# Patient Record
Sex: Female | Born: 1955
Health system: Southern US, Community
[De-identification: ages and names within clinical notes are randomized; demographics above are authoritative.]

## PROBLEM LIST (undated history)

## (undated) DIAGNOSIS — E079 Disorder of thyroid, unspecified: Secondary | ICD-10-CM

## (undated) DIAGNOSIS — F419 Anxiety disorder, unspecified: Secondary | ICD-10-CM

## (undated) DIAGNOSIS — M8580 Other specified disorders of bone density and structure, unspecified site: Secondary | ICD-10-CM

## (undated) DIAGNOSIS — F329 Major depressive disorder, single episode, unspecified: Secondary | ICD-10-CM

## (undated) DIAGNOSIS — K529 Noninfective gastroenteritis and colitis, unspecified: Secondary | ICD-10-CM

## (undated) DIAGNOSIS — J449 Chronic obstructive pulmonary disease, unspecified: Secondary | ICD-10-CM

## (undated) DIAGNOSIS — F32A Depression, unspecified: Secondary | ICD-10-CM

## (undated) HISTORY — DX: Major depressive disorder, single episode, unspecified: F32.9

## (undated) HISTORY — DX: Depression, unspecified: F32.A

## (undated) HISTORY — DX: Anxiety disorder, unspecified: F41.9

## (undated) HISTORY — DX: Disorder of thyroid, unspecified: E07.9

## (undated) HISTORY — DX: Noninfective gastroenteritis and colitis, unspecified: K52.9

## (undated) HISTORY — DX: Chronic obstructive pulmonary disease, unspecified: J44.9

## (undated) HISTORY — PX: DILATION AND CURETTAGE OF UTERUS: SHX78

## (undated) HISTORY — DX: Other specified disorders of bone density and structure, unspecified site: M85.80

---

## 1998-11-22 ENCOUNTER — Other Ambulatory Visit: Admission: RE | Admit: 1998-11-22 | Discharge: 1998-11-22 | Payer: Self-pay | Admitting: *Deleted

## 1998-11-30 ENCOUNTER — Other Ambulatory Visit: Admission: RE | Admit: 1998-11-30 | Discharge: 1998-11-30 | Payer: Self-pay | Admitting: *Deleted

## 1998-12-20 ENCOUNTER — Encounter (INDEPENDENT_AMBULATORY_CARE_PROVIDER_SITE_OTHER): Payer: Self-pay | Admitting: Specialist

## 1998-12-20 ENCOUNTER — Other Ambulatory Visit: Admission: RE | Admit: 1998-12-20 | Discharge: 1998-12-20 | Payer: Self-pay | Admitting: *Deleted

## 2000-08-17 ENCOUNTER — Other Ambulatory Visit: Admission: RE | Admit: 2000-08-17 | Discharge: 2000-08-17 | Payer: Self-pay | Admitting: *Deleted

## 2001-09-14 ENCOUNTER — Encounter: Admission: RE | Admit: 2001-09-14 | Discharge: 2001-09-14 | Payer: Self-pay | Admitting: Family Medicine

## 2001-09-14 ENCOUNTER — Encounter: Payer: Self-pay | Admitting: Family Medicine

## 2002-10-17 ENCOUNTER — Ambulatory Visit (HOSPITAL_COMMUNITY): Admission: RE | Admit: 2002-10-17 | Discharge: 2002-10-17 | Payer: Self-pay | Admitting: Pulmonary Disease

## 2002-10-17 ENCOUNTER — Encounter: Payer: Self-pay | Admitting: Pulmonary Disease

## 2003-05-25 ENCOUNTER — Other Ambulatory Visit: Admission: RE | Admit: 2003-05-25 | Discharge: 2003-05-25 | Payer: Self-pay | Admitting: Family Medicine

## 2003-06-20 ENCOUNTER — Ambulatory Visit (HOSPITAL_COMMUNITY): Admission: RE | Admit: 2003-06-20 | Discharge: 2003-06-20 | Payer: Self-pay | Admitting: Pulmonary Disease

## 2004-07-15 ENCOUNTER — Ambulatory Visit: Payer: Self-pay | Admitting: Family Medicine

## 2004-11-06 ENCOUNTER — Ambulatory Visit: Payer: Self-pay | Admitting: Family Medicine

## 2005-05-22 ENCOUNTER — Ambulatory Visit: Payer: Self-pay | Admitting: Family Medicine

## 2005-09-23 ENCOUNTER — Ambulatory Visit: Payer: Self-pay | Admitting: Family Medicine

## 2005-10-07 ENCOUNTER — Ambulatory Visit: Payer: Self-pay | Admitting: Family Medicine

## 2005-10-14 ENCOUNTER — Ambulatory Visit: Payer: Self-pay | Admitting: Family Medicine

## 2006-01-28 ENCOUNTER — Ambulatory Visit: Payer: Self-pay | Admitting: Family Medicine

## 2006-02-17 ENCOUNTER — Ambulatory Visit: Payer: Self-pay | Admitting: Family Medicine

## 2006-04-16 DIAGNOSIS — M899 Disorder of bone, unspecified: Secondary | ICD-10-CM | POA: Insufficient documentation

## 2006-04-16 DIAGNOSIS — M949 Disorder of cartilage, unspecified: Secondary | ICD-10-CM

## 2006-04-16 DIAGNOSIS — M810 Age-related osteoporosis without current pathological fracture: Secondary | ICD-10-CM | POA: Insufficient documentation

## 2006-04-16 DIAGNOSIS — M8580 Other specified disorders of bone density and structure, unspecified site: Secondary | ICD-10-CM | POA: Insufficient documentation

## 2006-04-16 LAB — HM DEXA SCAN

## 2006-04-20 ENCOUNTER — Ambulatory Visit: Payer: Self-pay | Admitting: Family Medicine

## 2006-05-18 ENCOUNTER — Encounter: Payer: Self-pay | Admitting: Family Medicine

## 2006-05-18 ENCOUNTER — Ambulatory Visit: Payer: Self-pay | Admitting: Family Medicine

## 2006-05-18 ENCOUNTER — Other Ambulatory Visit: Admission: RE | Admit: 2006-05-18 | Discharge: 2006-05-18 | Payer: Self-pay | Admitting: Family Medicine

## 2006-05-18 LAB — CONVERTED CEMR LAB: Pap Smear: NORMAL

## 2006-07-03 ENCOUNTER — Ambulatory Visit: Payer: Self-pay | Admitting: Family Medicine

## 2006-08-12 ENCOUNTER — Ambulatory Visit: Payer: Self-pay | Admitting: Family Medicine

## 2006-09-01 ENCOUNTER — Encounter: Payer: Self-pay | Admitting: Family Medicine

## 2006-09-01 DIAGNOSIS — F419 Anxiety disorder, unspecified: Secondary | ICD-10-CM | POA: Insufficient documentation

## 2006-09-01 DIAGNOSIS — J449 Chronic obstructive pulmonary disease, unspecified: Secondary | ICD-10-CM | POA: Insufficient documentation

## 2006-09-03 ENCOUNTER — Ambulatory Visit: Payer: Self-pay | Admitting: Family Medicine

## 2006-12-23 ENCOUNTER — Ambulatory Visit: Payer: Self-pay | Admitting: Family Medicine

## 2006-12-23 ENCOUNTER — Encounter (INDEPENDENT_AMBULATORY_CARE_PROVIDER_SITE_OTHER): Payer: Self-pay | Admitting: Internal Medicine

## 2007-01-04 ENCOUNTER — Telehealth (INDEPENDENT_AMBULATORY_CARE_PROVIDER_SITE_OTHER): Payer: Self-pay | Admitting: *Deleted

## 2007-01-05 ENCOUNTER — Encounter (INDEPENDENT_AMBULATORY_CARE_PROVIDER_SITE_OTHER): Payer: Self-pay | Admitting: Internal Medicine

## 2007-01-06 ENCOUNTER — Ambulatory Visit: Payer: Self-pay | Admitting: Family Medicine

## 2007-01-29 ENCOUNTER — Encounter: Payer: Self-pay | Admitting: Family Medicine

## 2007-03-03 ENCOUNTER — Ambulatory Visit: Payer: Self-pay | Admitting: Family Medicine

## 2007-03-08 ENCOUNTER — Telehealth (INDEPENDENT_AMBULATORY_CARE_PROVIDER_SITE_OTHER): Payer: Self-pay | Admitting: *Deleted

## 2007-03-12 ENCOUNTER — Ambulatory Visit: Payer: Self-pay | Admitting: Family Medicine

## 2007-03-22 ENCOUNTER — Encounter: Payer: Self-pay | Admitting: Family Medicine

## 2007-03-30 ENCOUNTER — Telehealth: Payer: Self-pay | Admitting: Family Medicine

## 2007-08-25 ENCOUNTER — Telehealth: Payer: Self-pay | Admitting: Family Medicine

## 2007-10-07 ENCOUNTER — Ambulatory Visit: Payer: Self-pay | Admitting: Family Medicine

## 2007-11-16 ENCOUNTER — Ambulatory Visit: Payer: Self-pay | Admitting: Family Medicine

## 2007-11-29 ENCOUNTER — Telehealth: Payer: Self-pay | Admitting: Family Medicine

## 2008-02-04 ENCOUNTER — Encounter: Payer: Self-pay | Admitting: Family Medicine

## 2008-02-15 ENCOUNTER — Encounter: Payer: Self-pay | Admitting: Family Medicine

## 2008-02-23 ENCOUNTER — Encounter: Payer: Self-pay | Admitting: Family Medicine

## 2008-03-03 ENCOUNTER — Encounter: Payer: Self-pay | Admitting: Family Medicine

## 2008-04-19 ENCOUNTER — Encounter: Payer: Self-pay | Admitting: Family Medicine

## 2008-06-21 ENCOUNTER — Ambulatory Visit: Payer: Self-pay | Admitting: Family Medicine

## 2008-06-26 ENCOUNTER — Encounter: Payer: Self-pay | Admitting: Family Medicine

## 2008-07-25 ENCOUNTER — Ambulatory Visit: Payer: Self-pay | Admitting: Family Medicine

## 2008-07-26 LAB — CONVERTED CEMR LAB
BUN: 9 mg/dL (ref 6–23)
CO2: 30 meq/L (ref 19–32)
Calcium: 8.9 mg/dL (ref 8.4–10.5)
Chloride: 104 meq/L (ref 96–112)
Creatinine, Ser: 0.8 mg/dL (ref 0.4–1.2)
GFR calc Af Amer: 97 mL/min
GFR calc non Af Amer: 80 mL/min
Glucose, Bld: 83 mg/dL (ref 70–99)
Potassium: 4 meq/L (ref 3.5–5.1)
Sodium: 139 meq/L (ref 135–145)
TSH: 3.24 microintl units/mL (ref 0.35–5.50)

## 2008-08-07 ENCOUNTER — Encounter: Payer: Self-pay | Admitting: Family Medicine

## 2008-08-08 ENCOUNTER — Telehealth: Payer: Self-pay | Admitting: Family Medicine

## 2008-08-25 ENCOUNTER — Ambulatory Visit: Payer: Self-pay | Admitting: Family Medicine

## 2008-08-25 DIAGNOSIS — J209 Acute bronchitis, unspecified: Secondary | ICD-10-CM | POA: Insufficient documentation

## 2008-08-25 DIAGNOSIS — F17200 Nicotine dependence, unspecified, uncomplicated: Secondary | ICD-10-CM | POA: Insufficient documentation

## 2008-08-28 ENCOUNTER — Encounter: Payer: Self-pay | Admitting: Family Medicine

## 2008-08-30 DIAGNOSIS — J984 Other disorders of lung: Secondary | ICD-10-CM | POA: Insufficient documentation

## 2008-08-31 ENCOUNTER — Telehealth: Payer: Self-pay | Admitting: Family Medicine

## 2008-09-01 ENCOUNTER — Encounter: Payer: Self-pay | Admitting: Family Medicine

## 2008-09-20 ENCOUNTER — Ambulatory Visit: Payer: Self-pay | Admitting: Family Medicine

## 2008-12-28 ENCOUNTER — Telehealth: Payer: Self-pay | Admitting: Family Medicine

## 2009-02-02 ENCOUNTER — Ambulatory Visit: Payer: Self-pay | Admitting: Family Medicine

## 2009-02-02 DIAGNOSIS — F411 Generalized anxiety disorder: Secondary | ICD-10-CM | POA: Insufficient documentation

## 2009-05-30 ENCOUNTER — Encounter: Payer: Self-pay | Admitting: Family Medicine

## 2009-07-04 ENCOUNTER — Telehealth: Payer: Self-pay | Admitting: Family Medicine

## 2009-07-06 ENCOUNTER — Ambulatory Visit: Payer: Self-pay | Admitting: Family Medicine

## 2009-08-03 ENCOUNTER — Encounter: Payer: Self-pay | Admitting: Family Medicine

## 2009-09-05 ENCOUNTER — Telehealth: Payer: Self-pay | Admitting: Family Medicine

## 2009-09-21 ENCOUNTER — Other Ambulatory Visit: Admission: RE | Admit: 2009-09-21 | Discharge: 2009-09-21 | Payer: Self-pay | Admitting: Family Medicine

## 2009-09-21 ENCOUNTER — Ambulatory Visit: Payer: Self-pay | Admitting: Family Medicine

## 2009-09-24 ENCOUNTER — Encounter: Payer: Self-pay | Admitting: Family Medicine

## 2009-09-24 LAB — CONVERTED CEMR LAB
ALT: 13 units/L (ref 0–35)
AST: 17 units/L (ref 0–37)
Albumin: 4.2 g/dL (ref 3.5–5.2)
Alkaline Phosphatase: 96 units/L (ref 39–117)
BUN: 10 mg/dL (ref 6–23)
Basophils Absolute: 0 10*3/uL (ref 0.0–0.1)
Basophils Relative: 0.4 % (ref 0.0–3.0)
Bilirubin, Direct: 0 mg/dL (ref 0.0–0.3)
CO2: 30 meq/L (ref 19–32)
Calcium: 9.2 mg/dL (ref 8.4–10.5)
Chloride: 106 meq/L (ref 96–112)
Cholesterol: 149 mg/dL (ref 0–200)
Creatinine, Ser: 0.9 mg/dL (ref 0.4–1.2)
Eosinophils Absolute: 0.1 10*3/uL (ref 0.0–0.7)
Eosinophils Relative: 0.6 % (ref 0.0–5.0)
GFR calc non Af Amer: 69.8 mL/min (ref >60)
Glucose, Bld: 85 mg/dL (ref 70–99)
HCT: 41.6 % (ref 36.0–46.0)
HDL: 46.1 mg/dL (ref >39.00)
Hemoglobin: 14.5 g/dL (ref 12.0–15.0)
LDL Cholesterol: 85 mg/dL (ref 0–99)
Lymphocytes Relative: 23.1 % (ref 12.0–46.0)
Lymphs Abs: 2 10*3/uL (ref 0.7–4.0)
MCHC: 34.9 g/dL (ref 30.0–36.0)
MCV: 92.9 fL (ref 78.0–100.0)
Monocytes Absolute: 0.6 10*3/uL (ref 0.1–1.0)
Monocytes Relative: 6.6 % (ref 3.0–12.0)
Neutro Abs: 5.9 10*3/uL (ref 1.4–7.7)
Neutrophils Relative %: 69.3 % (ref 43.0–77.0)
Platelets: 282 10*3/uL (ref 150.0–400.0)
Potassium: 4.1 meq/L (ref 3.5–5.1)
RBC: 4.48 M/uL (ref 3.87–5.11)
RDW: 12.1 % (ref 11.5–14.6)
Sodium: 144 meq/L (ref 135–145)
Total Bilirubin: 0.6 mg/dL (ref 0.3–1.2)
Total CHOL/HDL Ratio: 3
Total Protein: 7.4 g/dL (ref 6.0–8.3)
Triglycerides: 92 mg/dL (ref 0.0–149.0)
VLDL: 18.4 mg/dL (ref 0.0–40.0)
Vit D, 25-Hydroxy: 26 ng/mL — ABNORMAL LOW (ref 30–89)
WBC: 8.5 10*3/uL (ref 4.5–10.5)

## 2009-09-25 ENCOUNTER — Encounter: Payer: Self-pay | Admitting: Family Medicine

## 2009-09-25 LAB — HM MAMMOGRAPHY: HM Mammogram: NORMAL

## 2009-09-27 ENCOUNTER — Encounter: Payer: Self-pay | Admitting: Family Medicine

## 2009-10-01 ENCOUNTER — Encounter (INDEPENDENT_AMBULATORY_CARE_PROVIDER_SITE_OTHER): Payer: Self-pay | Admitting: *Deleted

## 2009-10-01 ENCOUNTER — Encounter: Payer: Self-pay | Admitting: Family Medicine

## 2009-10-02 ENCOUNTER — Encounter (INDEPENDENT_AMBULATORY_CARE_PROVIDER_SITE_OTHER): Payer: Self-pay | Admitting: *Deleted

## 2009-10-02 LAB — CONVERTED CEMR LAB: Pap Smear: NEGATIVE

## 2009-10-15 ENCOUNTER — Telehealth: Payer: Self-pay | Admitting: Family Medicine

## 2009-10-15 ENCOUNTER — Ambulatory Visit: Payer: Self-pay | Admitting: Family Medicine

## 2009-10-16 ENCOUNTER — Encounter: Payer: Self-pay | Admitting: Family Medicine

## 2009-10-23 ENCOUNTER — Ambulatory Visit: Payer: Self-pay | Admitting: Family Medicine

## 2009-10-23 DIAGNOSIS — H1045 Other chronic allergic conjunctivitis: Secondary | ICD-10-CM | POA: Insufficient documentation

## 2009-12-06 ENCOUNTER — Ambulatory Visit: Payer: Self-pay | Admitting: Family Medicine

## 2009-12-06 DIAGNOSIS — E559 Vitamin D deficiency, unspecified: Secondary | ICD-10-CM | POA: Insufficient documentation

## 2009-12-12 ENCOUNTER — Encounter: Payer: Self-pay | Admitting: Family Medicine

## 2009-12-12 ENCOUNTER — Ambulatory Visit: Payer: Self-pay | Admitting: Family Medicine

## 2009-12-19 ENCOUNTER — Telehealth: Payer: Self-pay | Admitting: Family Medicine

## 2010-01-09 ENCOUNTER — Ambulatory Visit: Payer: Self-pay | Admitting: Family Medicine

## 2010-01-25 ENCOUNTER — Ambulatory Visit: Payer: Self-pay | Admitting: *Deleted

## 2010-02-01 ENCOUNTER — Ambulatory Visit: Payer: Self-pay | Admitting: *Deleted

## 2010-02-19 ENCOUNTER — Telehealth: Payer: Self-pay | Admitting: Family Medicine

## 2010-04-05 ENCOUNTER — Ambulatory Visit: Payer: Self-pay | Admitting: Family Medicine

## 2010-07-16 NOTE — Assessment & Plan Note (Signed)
Summary: Follow up bone density Regina Ruiz   Vital Signs:  Patient profile:   55 year old female Height:      62.5 inches Weight:      123.75 pounds BMI:     22.35 Temp:     97.8 degrees F oral Pulse rate:   76 / minute Pulse rhythm:   regular BP sitting:   114 / 68  (left arm) Cuff size:   regular  Vitals Entered By: Lewanda Rife LPN (December 12, 2009 8:43 AM) CC: follow up after bone density and labs   History of Present Illness: here for f/u of bone density test   vit d level is better at 53 from 26 after high dose weekly tx   bmd in hip is down 10.6 % with T score of neg 1.8, and LS down 8.1 % is smoker and hypothyroid   has cut back even more with smoking -- less than 1ppd - making good progress with intent to quit  plans to set quit date - poss in sept   no stomach problems  no dental or jaw problems  no broken bones     much stress- her mother passed away on 10-30-22 , and husb was in hosp with alcoholic blackout      Allergies: 1)  Fiorinal (Butalbital-Aspirin-Caffeine) 2)  Demerol (Meperidine Hcl)  Past History:  Past Medical History: Last updated: 10/07/2007 Anxiety C O P D Depression Hyperthyroidism smoking  Past Surgical History: Last updated: 08/30/2008 D&C FNA thyroid (adenoma) endometrial by (-), 11/03 dexa, mild osterpenia 11/07 CXR (3/10)- fibrotic change, and pulmonary nodules  Family History: Last updated: 10/07/2007 Father: lung ca, brain ca  Mother:  Siblings: sister breast ca MGF cva son, congenital hip disease GF cva distant M relatives- ? heart problems  Social History: Last updated: 07/06/2009 married 1 child Patient currently smokes.  husband is chronically ill with cva and alcoholism  Risk Factors: Smoking Status: current (09/01/2006)  Review of Systems General:  Denies fatigue, loss of appetite, and malaise. Eyes:  Denies blurring. ENT:  Denies sore throat. CV:  Denies chest pain or discomfort and  palpitations. Resp:  Denies cough and wheezing. GI:  Denies abdominal pain, indigestion, nausea, and vomiting. MS:  Denies joint pain, cramps, muscle weakness, and stiffness. Derm:  Denies itching, lesion(s), and rash. Neuro:  Denies numbness and tingling. Psych:  very stressed caring for alcoholic husband. Endo:  Denies cold intolerance, excessive thirst, excessive urination, and heat intolerance. Heme:  Denies abnormal bruising and bleeding.  Physical Exam  General:  petite female in no distress Head:  normocephalic, atraumatic, and no abnormalities observed.   Mouth:  pharynx pink and moist.   Neck:  No deformities, masses, or tenderness noted. Lungs:  diffusely distant bs- no wheeze Heart:  Normal rate and regular rhythm. S1 and S2 normal without gallop, murmur, click, rub or other extra sounds. Abdomen:  soft and non-tender.   Msk:  No deformity or scoliosis noted of thoracic or lumbar spine.  no acute joint changes  Extremities:  No clubbing, cyanosis, edema, or deformity noted with normal full range of motion of all joints.   Neurologic:  gait normal and DTRs symmetrical and normal.   Skin:  Intact without suspicious lesions or rashes Cervical Nodes:  No lymphadenopathy noted Psych:  nl affect- talks candidly about stress    Impression & Recommendations:  Problem # 1:  OSTEOPENIA (ICD-733.90) Assessment Deteriorated in pt with risk factors of petite frame/ smoking/ hypothyroid /  vit D def will inc vit D to 2000 international units daiily urged compliance with ca  start fosamax-disc pros and cons in detail- if side eff inst to stop and update safety issues disc in detail today plan dexa in 2 y The following medications were removed from the medication list:    Vitamin D (ergocalciferol) 50000 Unit Caps (Ergocalciferol) .[name]... Take one capsule by mouth every week for 10 weeks Her updated medication list for this problem includes:    Vitamin D 400 Unit Tabs  (Cholecalciferol) .[name]... Take 1 tablet by mouth once a day    Fosamax 70 Mg Tabs (Alendronate sodium) .[name]... 1 by mouth once weekly as directed  Problem # 2:  UNSPECIFIED VITAMIN D DEFICIENCY (ICD-268.9) Assessment: Improved much imp after high dose weekly tx  inc daily to 2000 international units  re check about 6 mo   Problem # 3:  ANXIETY (ICD-300.00) Assessment: Unchanged disc recent sit stress ok overall/ some grief  good supp in family no change in med  Her updated medication list for this problem includes:    Alprazolam 0.25 Mg Tabs (Alprazolam) .[name]... 1/2 - 1 two times a day as needed    Buspirone Hcl 15 Mg Tabs (Buspirone hcl) .[name]... 1/2 by mouth two times a day for anxiety  Problem # 4:  CIGARETTE SMOKER (ICD-305.1) Assessment: Improved doing well cutting down with intent to quit discussed in detail risks of smoking, and possible outcomes including COPD, vascular dz, cancer and also respiratory infections/sinus problems  continue to enc her   Complete Medication List: 1)  Alprazolam 0.25 Mg Tabs (Alprazolam) .... 1/2 - 1 two times a day as needed 2)  Propranolol Hcl 10 Mg Tabs (Propranolol hcl) .... Once daily as needed 3)  Calcium + D Ultra 1000 Mg  .... Two times a day 4)  Elocon 0.1 % Crea (Mometasone furoate) .... Apply to affected area once daily as needed 5)  Tessalon 200 Mg Caps (Benzonatate) .[name].. 1 by mouth three times a day as needed cough 6)  Synthroid 100 Mcg Tabs (Levothyroxine sodium) .... Take 1 tablet by mouth once a day 7)  Buspirone Hcl 15 Mg Tabs (Buspirone hcl) .... 1/2 by mouth two times a day for anxiety 8)  Patanol 0.1 % Soln (Olopatadine hcl) .[name].. 1 gtt in each eye daily as needed 9)  Vitamin D 400 Unit Tabs (Cholecalciferol) .... Take 1 tablet by mouth once a day 10)  Fosamax 70 Mg Tabs (Alendronate sodium) .[name].. 1 by mouth once weekly as directed  Patient Instructions: 1)  take 1200 mg calcium daily  2)  take 2000 international units vitamin D  daily  3)  exercise helps too  4)  start fosamax- stop it if any side effects   Prescriptions: FOSAMAX 70 MG TABS (ALENDRONATE SODIUM) 1 by mouth once weekly as directed  #4 x 11   Entered and Authorized by:   Judith Part MD   Signed by:   Judith Part MD on 12/12/2009   Method used:   Electronically to        [facility]* (retail)       [address]       [place], [place]  [address]       Ph: [phone]       Fax: [phone]   RxID:   [id number]   Current Allergies (reviewed today): FIORINAL (BUTALBITAL-ASPIRIN-CAFFEINE) DEMEROL (MEPERIDINE HCL)

## 2010-07-16 NOTE — Progress Notes (Signed)
  Phone Note Call from Patient   Caller: Patient Call For: Judith Part MD Summary of Call: Patient returned my call, I had called her because the [facility] lab informed me the patient never returned her ifob stool kit. The patient said" she will not be doing the test, she didn't want it done". Initial call taken by: Mills Koller,  December 19, 2009 4:15 PM  Follow-up for Phone Call        thanks for the update - I will document that Follow-up by: Judith Part MD,  December 19, 2009 5:15 PM      Preventive Care Screening     pt declines stool immunoassay card for colon screen 12/19/2009

## 2010-07-16 NOTE — Miscellaneous (Signed)
Summary: Vitamin D 50000iu rx  Medications Added VITAMIN D (ERGOCALCIFEROL) 50000 UNIT CAPS (ERGOCALCIFEROL) Take one capsule by mouth every week for 10 weeks       Clinical Lists Changes  Medications: Added new medication of VITAMIN D (ERGOCALCIFEROL) 50000 UNIT CAPS (ERGOCALCIFEROL) Take one capsule by mouth every week for 10 weeks - Signed Rx of VITAMIN D (ERGOCALCIFEROL) 50000 UNIT CAPS (ERGOCALCIFEROL) Take one capsule by mouth every week for 10 weeks;  #10 x 0;  Signed;  Entered by: Lewanda Rife LPN;  Authorized by: Judith Part MD;  Method used: Electronically to [facility]*, [address], [place], [place]  [address], Ph: [phone], Fax: [phone]    Prescriptions: VITAMIN D (ERGOCALCIFEROL) 50000 UNIT CAPS (ERGOCALCIFEROL) Take one capsule by mouth every week for 10 weeks  #10 x 0   Entered by:   Lewanda Rife LPN   Authorized by:   Judith Part MD   Signed by:   Lewanda Rife LPN on 13/01/6577   Method used:   Electronically to        [facility]* (retail)       [address]       [place], [place]  [address]       Ph: [phone]       Fax: [phone]   RxID:   [id number]  Pt scheduled lab appt 12/06/09 at 8:20am to recheck Vitamin D level.Lewanda Rife LPN  September 24, 2009 11:34 AM  Current Allergies: FIORINAL (BUTALBITAL-ASPIRIN-CAFFEINE) DEMEROL (MEPERIDINE HCL)

## 2010-07-16 NOTE — Miscellaneous (Signed)
   Clinical Lists Changes  Observations: Added new observation of MAMMO DUE: 09/26/2010 (09/25/2009 11:33) Added new observation of MAMMOGRAM: Normal (09/25/2009 11:33)

## 2010-07-16 NOTE — Assessment & Plan Note (Signed)
Summary: PAIN UNDER RIBS/ALC   Vital Signs:  Patient profile:   55 year old female Height:      62.5 inches Weight:      126.50 pounds BMI:     22.85 Temp:     97.7 degrees F oral Pulse rate:   76 / minute Pulse rhythm:   regular BP sitting:   100 / 66  (left arm) Cuff size:   regular  Vitals Entered By: Lewanda Rife LPN (January 09, 2010 3:39 PM) CC: pain under both sides of ribs   History of Present Illness: pain started sunday pain starts just under ribs and radiates to her back  perhaps stress related  a lot of burping and having gas  ? to say if it varies with eating  some foods make it worse like cucumbers  also hurts if she eats too much   has not tried any otc meds  thought about probiotics   no heartburn   drinks coffee in the am - water during the day, and 2 teas at night  on vacation 2 weeks ago- had more soda   definitely a lot of stress- living with it   ribs are not hurting   gets puffy/ bloated   smoking is the same breathing is better -not as much cough   Allergies: 1)  Fiorinal (Butalbital-Aspirin-Caffeine) 2)  Demerol (Meperidine Hcl)  Past History:  Past Medical History: Last updated: 10/07/2007 Anxiety C O P D Depression Hyperthyroidism smoking  Past Surgical History: Last updated: 08/30/2008 D&C FNA thyroid (adenoma) endometrial by (-), 11/03 dexa, mild osterpenia 11/07 CXR (3/10)- fibrotic change, and pulmonary nodules  Family History: Last updated: 10/07/2007 Father: lung ca, brain ca  Mother:  Siblings: sister breast ca MGF cva son, congenital hip disease GF cva distant M relatives- ? heart problems  Social History: Last updated: 07/06/2009 married 1 child Patient currently smokes.  husband is chronically ill with cva and alcoholism  Risk Factors: Smoking Status: current (09/01/2006)  Review of Systems General:  Complains of fatigue; denies chills, fever, loss of appetite, and malaise. Eyes:  Denies blurring  and eye irritation. ENT:  Denies sore throat. CV:  Denies chest pain or discomfort, lightheadness, palpitations, and shortness of breath with exertion. Resp:  Denies cough, shortness of breath, and wheezing. GI:  Complains of abdominal pain, gas, and indigestion; denies bloody stools, change in bowel habits, vomiting, and vomiting blood. GU:  Denies dysuria and hematuria. Derm:  Denies itching, lesion(s), poor wound healing, and rash. Neuro:  Denies weakness. Psych:  is generally stressed but doing ok . Endo:  Denies cold intolerance, excessive hunger, excessive urination, and heat intolerance. Heme:  Denies abnormal bruising and bleeding.  Physical Exam  General:  petite female in no distress Head:  normocephalic, atraumatic, and no abnormalities observed.   Eyes:  vision grossly intact, pupils equal, pupils round, and pupils reactive to light.  no conjunctival pallor, injection or icterus  Mouth:  pharynx pink and moist.   Neck:  supple with full rom and no masses or thyromegally, no JVD or carotid bruit  Lungs:  diffusely distant bs- no wheeze Heart:  Normal rate and regular rhythm. S1 and S2 normal without gallop, murmur, click, rub or other extra sounds. Abdomen:  tender epigastrium and bilat UQ of abd without rebound or gaurding soft, normal bowel sounds, no distention, and no masses.   Extremities:  No clubbing, cyanosis, edema, or deformity noted with normal full range of motion of all  joints.   Neurologic:  gait normal and DTRs symmetrical and normal.  no tremor  Skin:  Intact without suspicious lesions or rashes Cervical Nodes:  No lymphadenopathy noted Psych:  normal affect, talkative and pleasant    Impression & Recommendations:  Problem # 1:  GASTRITIS (ICD-535.50) Assessment New  suspect stress related  fosamax may also play role -less likely disc lifestyle change and foods/ bev to avoid start 2 mo course of omeprazole - and update if not better in 2 wk or if  worse also hold fosamax 2 mo  will continue to follow overall good coping skills  Her updated medication list for this problem includes:    Omeprazole 20 Mg Cpdr (Omeprazole) .[name]... 1 by mouth once daily in am before breakfast  Orders: Prescription Created Electronically [phone])  Problem # 2:  CIGARETTE SMOKER (ICD-305.1) Assessment: Unchanged urged to keep working on cutting down pt aware that smoking can add to reflux and gastritis  Orders: Prescription Created Electronically [phone])  Complete Medication List: 1)  Alprazolam 0.25 Mg Tabs (Alprazolam) .... 1/2 - 1 two times a day as needed 2)  Propranolol Hcl 10 Mg Tabs (Propranolol hcl) .... Once daily as needed 3)  Calcium + D Ultra 1000 Mg  .... Two times a day 4)  Elocon 0.1 % Crea (Mometasone furoate) .... Apply to affected area once daily as needed 5)  Tessalon 200 Mg Caps (Benzonatate) .[name].. 1 by mouth three times a day as needed cough 6)  Synthroid 100 Mcg Tabs (Levothyroxine sodium) .... Take 1 tablet by mouth once a day 7)  Buspirone Hcl 15 Mg Tabs (Buspirone hcl) .... 1/2 by mouth two times a day for anxiety 8)  Patanol 0.1 % Soln (Olopatadine hcl) .[name].. 1 gtt in each eye daily as needed 9)  Vitamin D 400 Unit Tabs (Cholecalciferol) .... Take 1 tablet by mouth once a day 10)  Fosamax 70 Mg Tabs (Alendronate sodium) .[name].. 1 by mouth once weekly as directed 11)  Omeprazole 20 Mg Cpdr (Omeprazole) .[name].. 1 by mouth once daily in am before breakfast  Patient Instructions: 1)  stop the fosamax for 2 months -- then try to re start and update me if symptoms return 2)  take omeprazole 20 mg each am (can take first dose today when you get home) for 2 months -- then try to wean off every other day for a month  3)  if your abdominal pain is not better in 2 weeks -or if worse at any time please call 4)  avoid caffine, soda, tea , and anything spicy for now  Prescriptions: OMEPRAZOLE 20 MG CPDR (OMEPRAZOLE) 1 by mouth once daily in am  before breakfast  #30 x 5   Entered and Authorized by:   Judith Part MD   Signed by:   Judith Part MD on 01/09/2010   Method used:   Electronically to        [facility]* (retail)       [address]       [place], [place]  [address]       Ph: [phone]       Fax: [phone]   RxID:   [id number]   Current Allergies (reviewed today): FIORINAL (BUTALBITAL-ASPIRIN-CAFFEINE) DEMEROL (MEPERIDINE HCL)

## 2010-07-16 NOTE — Progress Notes (Signed)
Summary: spasm under both breast   Phone Note Call from Patient   Caller: Patient Call For: Judith Part MD Summary of Call: For 2 months on and off pt has had sharpe spasm pain that comes and goes beneath both breast, usually upon pushing a box or sometimes just the way pt moves. Pt has had no shortness of breath or trouble breathing. The pain does not radiate into pt's arms or neck. No pain this AM. Last night had pain one time but wanted to get appt to see Dr. Milinda Antis. Dr. Milinda Antis said OK to make appt but if pain increases or changes or any trouble breathing pt should call back or if at night go to ER. Pt made appt for 07/06/09 at 3:30pm. Initial call taken by: Lewanda Rife LPN,  July 04, 2009 11:29 AM

## 2010-07-16 NOTE — Progress Notes (Signed)
Summary: IFOB Canceled  Phone Note Other Incoming   Caller: [facility] Summary of Call: IFOB from 4/11 was never returned to lab. Initial call taken by: Liane Comber CMA Duncan Dull),  Oct 15, 2009 11:01 AM  Follow-up for Phone Call        please check in with her about that  Follow-up by: Judith Part MD,  Oct 15, 2009 11:50 AM  Additional Follow-up for Phone Call Additional follow up Details #1::        Pt said she misplaced the kit and she would like to do another one.  Pt said she will send someone over to pick it up later this week, maybe Wed or Thur. Terri said would need a new order and she will leave it at the front desk.Lewanda Rife LPN  Oct 16, 6043 3:20 PM   that is ok -- MT Additional Follow-up by: Judith Part MD,  Oct 15, 2009 4:30 PM  New Problems: SCREENING, COLON CANCER (ICD-V76.51)   Additional Follow-up for Phone Call Additional follow up Details #2::    Dr Milinda Antis, Camelia Eng said she needs a new order for the ifob. Thank you.Lewanda Rife LPN  Oct 16, 4096 11:39 AM   ok --was printed in nurse in box Follow-up by: Judith Part MD,  Oct 16, 2009 12:26 PM  Additional Follow-up for Phone Call Additional follow up Details #3:: Details for Additional Follow-up Action Taken: Gave order to [facility] and she will leave for pt at front desk.Lewanda Rife LPN  Oct 16, 1189 12:44 PM   New Problems: SCREENING, COLON CANCER (ICD-V76.51)

## 2010-07-16 NOTE — Assessment & Plan Note (Signed)
Summary: SPASM UNDER BOTH BREAST ON AND OFF PER DR Lashon Beringer/[place]   Vital Signs:  Patient profile:   55 year old female Weight:      138 pounds Temp:     97.4 degrees F oral Pulse rate:   68 / minute Pulse rhythm:   regular BP sitting:   140 / 82  (left arm) Cuff size:   regular  Vitals Entered By: Lowella Petties CMA (July 06, 2009 3:33 PM) CC: Spasms under breasts off and on.   History of Present Illness: is having soreness in her chest under her breasts -- going on for about a month (not consistent)  is tender to the touch -- but also that helps some to press on it  like a spasm -- lasting about 1 minute  bending over to tie her shoes makes it worse  also worse when she was pushing a heavy box is coughing -- clear mucous  no shortness of breath - but has a little wheezing  no fever  no pain to take a deep breath   no meds otc or hot or cold compress (does not hurt now)    Allergies: 1)  Fiorinal (Butalbital-Aspirin-Caffeine) 2)  Demerol (Meperidine Hcl)  Past History:  Past Medical History: Last updated: 10/07/2007 Anxiety C O P D Depression Hyperthyroidism smoking  Past Surgical History: Last updated: 08/30/2008 D&C FNA thyroid (adenoma) endometrial by (-), 11/03 dexa, mild osterpenia 11/07 CXR (3/10)- fibrotic change, and pulmonary nodules  Family History: Last updated: 10/07/2007 Father: lung ca, brain ca  Mother:  Siblings: sister breast ca MGF cva son, congenital hip disease GF cva distant M relatives- ? heart problems  Social History: Last updated: 07/06/2009 married 1 child Patient currently smokes.  husband is chronically ill with cva and alcoholism  Risk Factors: Smoking Status: current (09/01/2006)  Social History: married 1 child Patient currently smokes.  husband is chronically ill with cva and alcoholism  Review of Systems General:  Denies fatigue, fever, loss of appetite, and malaise. Eyes:  Denies blurring and eye  irritation. CV:  Denies chest pain or discomfort, lightheadness, and palpitations. Resp:  Complains of chest pain with inspiration; denies cough, shortness of breath, and wheezing. GI:  Denies abdominal pain, change in bowel habits, indigestion, nausea, and vomiting. MS:  Denies joint pain, joint redness, and joint swelling. Derm:  Denies itching, lesion(s), poor wound healing, and rash. Neuro:  Denies numbness, tingling, and weakness. Psych:  is stressed by situation at home with alcoholic husband - but overall doing ok. Endo:  Denies cold intolerance, excessive thirst, excessive urination, and heat intolerance. Heme:  Denies abnormal bruising and bleeding.  Physical Exam  General:  Well-developed,well-nourished,in no acute distress; alert,appropriate and cooperative throughout examination Head:  normocephalic, atraumatic, and no abnormalities observed.   Eyes:  vision grossly intact, pupils equal, pupils round, and pupils reactive to light.  no conjunctival pallor, injection or icterus  Mouth:  pharynx pink and moist.   Neck:  supple with full rom and no masses or thyromegally, no JVD or carotid bruit  Chest Wall:  mildly tender L ant lower ribs no crepitice or skin change Lungs:  diffusely distant bs  no rales /rhonchi or wheeze  Heart:  Normal rate and regular rhythm. S1 and S2 normal without gallop, murmur, click, rub or other extra sounds. Abdomen:  soft, non-tender, normal bowel sounds, and no distention.   Msk:  No deformity or scoliosis noted of thoracic or lumbar spine.  no acute joint  changes  Extremities:  No clubbing, cyanosis, edema, or deformity noted with normal full range of motion of all joints.   Skin:  Intact without suspicious lesions or rashes Cervical Nodes:  No lymphadenopathy noted Inguinal Nodes:  No significant adenopathy Psych:  nl affect- though does become mildly tearful when disc husband's health   Impression & Recommendations:  Problem # 1:  CHEST  PAIN (ICD-786.50) Assessment New  intermittent and spasmotic - assoc with cough and change in position no change in EKG suspect costochondritis/ muscular strain will aggressively tx cough for 1-2 wk with tessalon- and if not imp - do CXR and rib films as always enc to keep working on smoking cessation  Orders: EKG w/ Interpretation (93000)  Problem # 2:  ANXIETY (ICD-300.00) Assessment: Deteriorated some stress rxn from home situation- but overall doing ok / good coping skills  adv to f/u if this worsens  Her updated medication list for this problem includes:    Alprazolam 0.25 Mg Tabs (Alprazolam) .[name]... 1/2 - 1 two times a day as needed  Complete Medication List: 1)  Alprazolam 0.25 Mg Tabs (Alprazolam) .... 1/2 - 1 two times a day as needed 2)  Propranolol Hcl 10 Mg Tabs (Propranolol hcl) .... Once daily as needed 3)  Calcium + D Ultra 1000 Mg  .... Two times a day 4)  Vit D  .... Once daily 5)  Optivar 0.05 % Soln (Azelastine hcl) .[name].. 1 drop two times a day affected eye  as needed 6)  Elocon 0.1 % Crea (Mometasone furoate) .... Apply to affected area once daily as needed 7)  Tessalon 200 Mg Caps (Benzonatate) .[name].. 1 by mouth three times a day as needed cough  Patient Instructions: 1)  use the tessalon for cough three times a day for at least 10 days  2)  if chest pains continue after that- call us to schedule chest and rib films  3)  update me earlier if this worsens 4)  work on quitting smoking  Prescriptions: TESSALON 200 MG CAPS (BENZONATATE) 1 by mouth three times a day as needed cough  #30 x 1   Entered and Authorized by:   Judith Part MD   Signed by:   Judith Part MD on 07/06/2009   Method used:   Print then Give to Patient   RxID:   [id number]   Prior Medications (reviewed today): ALPRAZOLAM 0.25 MG  TABS (ALPRAZOLAM) 1/2 - 1 two times a day as needed PROPRANOLOL HCL 10 MG  TABS (PROPRANOLOL HCL) once daily as needed CALCIUM + D  ULTRA 1000 MG ()  two times a day VIT D () once daily OPTIVAR 0.05 %  SOLN (AZELASTINE HCL) 1 drop two times a day affected eye  as needed ELOCON 0.1 % CREA (MOMETASONE FUROATE) apply to affected area once daily as needed TESSALON 200 MG CAPS (BENZONATATE) 1 by mouth three times a day as needed cough Current Allergies: FIORINAL (BUTALBITAL-ASPIRIN-CAFFEINE) DEMEROL (MEPERIDINE HCL)   EKG  Procedure date:  07/06/2009  Findings:      NSR with rate of 65 and no acute changes  nonspecific ST -T changes are stable from her last EKG (overall no change from last EKG)

## 2010-07-16 NOTE — Letter (Signed)
Summary: [facility]: Immunoassay Fecal Occult Blood (iFOB) Order Form  [place] [facility]  [address] [place], [place] [address]   Phone: [phone]  Fax: [phone]       Lab: Immunoassay Fecal Occult Blood (iFOB) Order Form   September 21, 2009 MRN: [id number]   [facility] 08/01/1955   Physicican Name:________Tower _________________  Diagnosis Code:___________V76.49_______________      Judith Part MD

## 2010-07-16 NOTE — Letter (Signed)
Summary: Results Follow up Letter  [place] [facility]  [address] [place], [place] [address]   Phone: [phone]  Fax: [phone]    10/02/2009 MRN: [id number]    [facility] [address] [place], [place]  [address]    Dear Regina Ruiz,  The following are the results of your recent test(s):  Test         Result    Pap Smear:        Normal ___X__  Not Normal _____ Comments: ______________________________________________________ Cholesterol: LDL(Bad cholesterol):         Your goal is less than:         HDL (Good cholesterol):       Your goal is more than: Comments:  ______________________________________________________ Mammogram:        Normal _____  Not Normal _____ Comments:  ___________________________________________________________________ Hemoccult:        Normal _____  Not normal _______ Comments:    _____________________________________________________________________ Other Tests:    We routinely do not discuss normal results over the telephone.  If you desire a copy of the results, or you have any questions about this information we can discuss them at your next office visit.   Sincerely,   Marne A. Milinda Antis, M.D.  MAT:lsf

## 2010-07-16 NOTE — Assessment & Plan Note (Signed)
Summary: EYES/CLE   Vital Signs:  Patient profile:   55 year old female Height:      62.5 inches Weight:      128.4 pounds BMI:     23.19 Temp:     97.5 degrees F oral Pulse rate:   80 / minute Pulse rhythm:   regular BP sitting:   120 / 70  (left arm) Cuff size:   regular  Vitals Entered By: Benny Lennert CMA Duncan Dull) (Oct 23, 2009 9:41 AM)  History of Present Illness: Chief complaint eyes  1 week of red eyes, itchy, watery. Started in left eye initially. Woke up in last 2 days with matted together...no yellow yellow green discharge. Some congestion...has been taking generic allegra..minimla improvement in eyes. No sneezing.  no fever, no SOB.  Is smoker has cut back..not ready to quit.    Problems Prior to Update: 1)  Screening, Colon Cancer  (ICD-V76.51) 2)  Routine Gynecological Examination  (ICD-V72.31) 3)  Health Maintenance Exam  (ICD-V70.0) 4)  Other Screening Mammogram  (ICD-V76.12) 5)  Anxiety  (ICD-300.00) 6)  Pulmonary Nodule  (ICD-518.89) 7)  Cigarette Smoker  (ICD-305.1) 8)  Bronchitis, Acute  (ICD-466.0) 9)  Cigarette Smoker  (ICD-305.1) 10)  Osteopenia  (ICD-733.90) 11)  Headache, Tension  (ICD-307.81) 12)  Hyperthyroidism  (ICD-242.90) 13)  C O P D  (ICD-496)  Current Medications (verified): 1)  Alprazolam 0.25 Mg  Tabs (Alprazolam) .... 1/2 - 1 Two Times A Day As Needed 2)  Propranolol Hcl 10 Mg  Tabs (Propranolol Hcl) .... Once Daily As Needed 3)  Calcium + D  Ultra 1000 Mg .... Two Times A Day 4)  Vit D .... Once Daily 5)  Elocon 0.1 % Crea (Mometasone Furoate) .... Apply To Affected Area Once Daily As Needed 6)  Tessalon 200 Mg Caps (Benzonatate) .[name].. 1 By Mouth Three Times A Day As Needed Cough 7)  Synthroid 100 Mcg Tabs (Levothyroxine Sodium) .... Take 1 Tablet By Mouth Once A Day 8)  Buspirone Hcl 15 Mg Tabs (Buspirone Hcl) .... 1/2 By Mouth Two Times A Day For Anxiety 9)  Vitamin D (Ergocalciferol) 50000 Unit Caps (Ergocalciferol) ....  Take One Capsule By Mouth Every Week For 10 Weeks 10)  Patanol 0.1 % Soln (Olopatadine Hcl) .[name].. 1 Gtt in Each Eye Daily  Allergies: 1)  Fiorinal (Butalbital-Aspirin-Caffeine) 2)  Demerol (Meperidine Hcl)  Past History:  Past medical, surgical, family and social histories (including risk factors) reviewed, and no changes noted (except as noted below).  Past Medical History: Reviewed history from 10/07/2007 and no changes required. Anxiety C O P D Depression Hyperthyroidism smoking  Past Surgical History: Reviewed history from 08/30/2008 and no changes required. D&C FNA thyroid (adenoma) endometrial by (-), 11/03 dexa, mild osterpenia 11/07 CXR (3/10)- fibrotic change, and pulmonary nodules  Family History: Reviewed history from 10/07/2007 and no changes required. Father: lung ca, brain ca  Mother:  Siblings: sister breast ca MGF cva son, congenital hip disease GF cva distant M relatives- ? heart problems  Social History: Reviewed history from 07/06/2009 and no changes required. married 1 child Patient currently smokes.  husband is chronically ill with cva and alcoholism  Review of Systems General:  Denies fatigue and fever. CV:  Denies chest pain or discomfort. Resp:  Denies shortness of breath.  Physical Exam  General:  Well-developed,well-nourished,in no acute distress; alert,appropriate and cooperative throughout examination Head:  no maxillary sinus ttp Eyes:  B red eye, EOMI, PERRLA Ears:  External  ear exam shows no significant lesions or deformities.  Otoscopic examination reveals clear canals, tympanic membranes are intact bilaterally without bulging, retraction, inflammation or discharge. Hearing is grossly normal bilaterally. Nose:  nasal dischargemucosal pallor.   Mouth:  Oral mucosa and oropharynx without lesions or exudates.  Teeth in good repair. Neck:  no carotid bruit or thyromegaly no cervical or supraclavicular lymphadenopathy  Lungs:   Normal respiratory effort, chest expands symmetrically. Lungs are clear to auscultation, no crackles or wheezes. Heart:  Normal rate and regular rhythm. S1 and S2 normal without gallop, murmur, click, rub or other extra sounds.   Impression & Recommendations:  Problem # 1:  ALLERGIC CONJUNCTIVITIS (ICD-372.14) Doubt viral infection given Bilateral and >1 week persistant.Marland Kitchenalso had allergy symptoms.  Treat with eye allergy drops Call if not improving.   Complete Medication List: 1)  Alprazolam 0.25 Mg Tabs (Alprazolam) .... 1/2 - 1 two times a day as needed 2)  Propranolol Hcl 10 Mg Tabs (Propranolol hcl) .... Once daily as needed 3)  Calcium + D Ultra 1000 Mg  .... Two times a day 4)  Vit D  .... Once daily 5)  Elocon 0.1 % Crea (Mometasone furoate) .... Apply to affected area once daily as needed 6)  Tessalon 200 Mg Caps (Benzonatate) .[name].. 1 by mouth three times a day as needed cough 7)  Synthroid 100 Mcg Tabs (Levothyroxine sodium) .... Take 1 tablet by mouth once a day 8)  Buspirone Hcl 15 Mg Tabs (Buspirone hcl) .... 1/2 by mouth two times a day for anxiety 9)  Vitamin D (ergocalciferol) 50000 Unit Caps (Ergocalciferol) .... Take one capsule by mouth every week for 10 weeks 10)  Patanol 0.1 % Soln (Olopatadine hcl) .[name].. 1 gtt in each eye daily Prescriptions: PATANOL 0.1 % SOLN (OLOPATADINE HCL) 1 gtt in each eye daily  #1 x 0   Entered and Authorized by:   Kerby Nora MD   Signed by:   Kerby Nora MD on 10/23/2009   Method used:   Electronically to        [facility]* (retail)       [address]       [place], [place]  [address]       Ph: [phone]       Fax: [phone]   RxID:   [id number]   Current Allergies (reviewed today): FIORINAL (BUTALBITAL-ASPIRIN-CAFFEINE) DEMEROL (MEPERIDINE HCL)

## 2010-07-16 NOTE — Assessment & Plan Note (Signed)
Summary: DISCUSS MEDICATIONS/CLE R/S 10/14   Vital Signs:  Patient profile:   55 year old female Height:      62.5 inches Weight:      128.75 pounds BMI:     23.26 Temp:     97.8 degrees F oral Pulse rate:   76 / minute Pulse rhythm:   regular BP sitting:   120 / 70  (left arm) Cuff size:   regular  Vitals Entered By: Lewanda Rife LPN (April 05, 2010 4:19 PM) CC: discuss med   History of Present Illness: did end up leaving her husband -- alcoholic he tried to commit suicide several times   she herself saw Janalyn Rouse for counseling and that was helpful  lives in [place] and changed her address  1 bedroom apt -- took her personal things  much more content  taking care of herself   she is overall feeling a lot better   stopped her omeprazole for gastritis - much imp   xanax is helpful - has not been taking very often   still having trouble sleeping at night  took otc sleep aid   never tried melatonin or volarian   knows she needs to quit smoking occ prod cough - no color or fever or sob   Allergies: 1)  Fiorinal (Butalbital-Aspirin-Caffeine) 2)  Demerol (Meperidine Hcl)  Past History:  Past Medical History: Last updated: 10/07/2007 Anxiety C O P D Depression Hyperthyroidism smoking  Past Surgical History: Last updated: 08/30/2008 D&C FNA thyroid (adenoma) endometrial by (-), 11/03 dexa, mild osterpenia 11/07 CXR (3/10)- fibrotic change, and pulmonary nodules  Family History: Last updated: 10/07/2007 Father: lung ca, brain ca  Mother:  Siblings: sister breast ca MGF cva son, congenital hip disease GF cva distant M relatives- ? heart problems  Social History: Last updated: 04/05/2010 married 1 child Patient currently smokes.  husband is chronically ill with cva and alcoholism-- she left hm in fall of 2011  Risk Factors: Smoking Status: current (09/01/2006)  Social History: married 1 child Patient currently smokes.  husband is  chronically ill with cva and alcoholism-- she left hm in fall of 2011  Review of Systems General:  Denies fatigue, loss of appetite, and malaise. Eyes:  Denies blurring and eye irritation. CV:  Denies chest pain or discomfort, palpitations, shortness of breath with exertion, and swelling of feet. Resp:  Denies cough, shortness of breath, and wheezing. GI:  Denies abdominal pain, change in bowel habits, indigestion, and nausea. GU:  Denies discharge, dysuria, and urinary frequency. MS:  Denies muscle aches and cramps. Derm:  Denies lesion(s), poor wound healing, and rash. Neuro:  Denies numbness and tingling. Psych:  Denies depression, irritability, panic attacks, sense of great danger, and suicidal thoughts/plans; anx is much better . Endo:  Denies cold intolerance, excessive thirst, excessive urination, and heat intolerance. Heme:  Denies abnormal bruising and bleeding.  Physical Exam  General:  Well-developed,well-nourished,in no acute distress; alert,appropriate and cooperative throughout examination Head:  normocephalic, atraumatic, and no abnormalities observed.   Eyes:  vision grossly intact, pupils equal, pupils round, and pupils reactive to light.  no conjunctival pallor, injection or icterus  Mouth:  pharynx pink and moist.   Neck:  supple with full rom and no masses or thyromegally, no JVD or carotid bruit  Chest Wall:  No deformities, masses, or tenderness noted. Lungs:  diffusely distant bs- no wheeze no rales or rhonchi  Heart:  Normal rate and regular rhythm. S1 and S2 normal without  gallop, murmur, click, rub or other extra sounds. Abdomen:  Bowel sounds positive,abdomen soft and non-tender without masses, organomegaly or hernias noted. Msk:  No deformity or scoliosis noted of thoracic or lumbar spine.   Pulses:  R and L carotid,radial,femoral,dorsalis pedis and posterior tibial pulses are full and equal bilaterally Extremities:  No clubbing, cyanosis, edema, or deformity  noted with normal full range of motion of all joints.   Neurologic:  sensation intact to light touch, gait normal, and DTRs symmetrical and normal.   Skin:  Intact without suspicious lesions or rashes Cervical Nodes:  No lymphadenopathy noted Psych:  normal affect, talkative and pleasant    Impression & Recommendations:  Problem # 1:  ANXIETY (ICD-300.00) Assessment Improved  much improved after leaving her alcoholic husband  is needing less med  disc stress red tech disc natural alternatives to help sleep in her new envirionment will f/u april Her updated medication list for this problem includes:    Alprazolam 0.25 Mg Tabs (Alprazolam) .[name]... 1/2 - 1 two times a day as needed    Buspirone Hcl 15 Mg Tabs (Buspirone hcl) .[name]... 1/2 by mouth two times a day for anxiety  Orders: Prescription Created Electronically [phone])  Problem # 2:  BRONCHITIS, ACUTE (ICD-466.0)  occ cough- poss rel to smoking  refil tessalon as needed will disc smoking cessation again at next visit  Her updated medication list for this problem includes:    Tessalon 200 Mg Caps (Benzonatate) .[name]... 1 by mouth three times a day as needed cough  Orders: Prescription Created Electronically [phone])  Complete Medication List: 1)  Alprazolam 0.25 Mg Tabs (Alprazolam) .... 1/2 - 1 two times a day as needed 2)  Propranolol Hcl 10 Mg Tabs (Propranolol hcl) .... Once daily as needed 3)  Calcium + D Ultra 1000 Mg  .... Two times a day 4)  Elocon 0.1 % Crea (Mometasone furoate) .... Apply to affected area once daily as needed 5)  Tessalon 200 Mg Caps (Benzonatate) .[name].. 1 by mouth three times a day as needed cough 6)  Synthroid 100 Mcg Tabs (Levothyroxine sodium) .... Take 1 tablet by mouth once a day 7)  Buspirone Hcl 15 Mg Tabs (Buspirone hcl) .... 1/2 by mouth two times a day for anxiety 8)  Patanol 0.1 % Soln (Olopatadine hcl) .[name].. 1 gtt in each eye daily as needed 9)  Vitamin D 400 Unit Tabs (Cholecalciferol) ....  Take 1 tablet by mouth once a day 10)  Fosamax 70 Mg Tabs (Alendronate sodium) .[name].. 1 by mouth once weekly as directed 11)  Omeprazole 20 Mg Cpdr (Omeprazole) .[name].. 1 by mouth once daily in am before breakfast as needed  Patient Instructions: 1)  make sure your px are all tranferred to new pharmacy  2)  have your new pharmacy call when you need a refil  3)  I'm glad you are doing better  4)  avoid caffiene  5)  try melatonin or volarian for sleep over the counter  6)  use alprazolam sparingly when you need to  7)  physical is due in april  Prescriptions: TESSALON 200 MG CAPS (BENZONATATE) 1 by mouth three times a day as needed cough  #30 x 1   Entered and Authorized by:   Judith Part MD   Signed by:   Judith Part MD on 04/05/2010   Method used:   Electronically to        [facility]  [address] [phone]* (retail)  [address]       [place], [place]  [address]       Ph: [phone] or [phone]       Fax: [phone]   RxID:   [id number]    Orders Added: 1)  Est. Patient Level III [[address]] 2)  Prescription Created Electronically [phone]    Current Allergies (reviewed today): FIORINAL (BUTALBITAL-ASPIRIN-CAFFEINE) DEMEROL (MEPERIDINE HCL)

## 2010-07-16 NOTE — Progress Notes (Signed)
Summary: refill request for alprazolam  Phone Note Refill Request Message from:  Fax from Pharmacy  Refills Requested: Medication #1:  ALPRAZOLAM 0.25 MG  TABS 1/2 - 1 two times a day as needed   Last Refilled: 11/29/2007 Faxed request from [facility], [phone].  Initial call taken by: Lowella Petties CMA,  September 05, 2009 12:16 PM  Follow-up for Phone Call        px written on EMR for call in  Follow-up by: Judith Part MD,  September 05, 2009 1:15 PM  Additional Follow-up for Phone Call Additional follow up Details #1::        Medication phoned to [facility]  pharmacy as instructed. Lewanda Rife LPN  September 05, 2009 4:39 PM     New/Updated Medications: ALPRAZOLAM 0.25 MG  TABS (ALPRAZOLAM) 1/2 - 1 two times a day as needed Prescriptions: ALPRAZOLAM 0.25 MG  TABS (ALPRAZOLAM) 1/2 - 1 two times a day as needed  #60 x 0   Entered and Authorized by:   Judith Part MD   Signed by:   Lewanda Rife LPN on 27/25/3664   Method used:   Telephoned to ...       [facility]* (retail)       [address]       [place], [place]  [address]       Ph: [phone]       Fax: [phone]   RxID:   [id number]

## 2010-07-16 NOTE — Progress Notes (Signed)
Summary: Alprazolam 0.25mg  rx  Phone Note Refill Request Call back at [phone] Message from:  [facility] on February 19, 2010 10:55 AM  Refills Requested: Medication #1:  ALPRAZOLAM 0.25 MG  TABS 1/2 - 1 two times a day as needed   Last Refilled: 09/05/2009 [facility] faxed refill request for Alprazolam 0.25mg . Last refill date 09/05/09. Please advise.    Method Requested: Telephone to Pharmacy Initial call taken by: Lewanda Rife LPN,  February 19, 2010 10:56 AM  Follow-up for Phone Call        px written on EMR for call in  Follow-up by: Judith Part MD,  February 19, 2010 10:56 AM  Additional Follow-up for Phone Call Additional follow up Details #1::        Medication phoned to [facility]  pharmacy as instructed. Lewanda Rife LPN  February 19, 2010 2:51 PM     New/Updated Medications: ALPRAZOLAM 0.25 MG  TABS (ALPRAZOLAM) 1/2 - 1 two times a day as needed Prescriptions: ALPRAZOLAM 0.25 MG  TABS (ALPRAZOLAM) 1/2 - 1 two times a day as needed  #60 x 0   Entered and Authorized by:   Judith Part MD   Signed by:   Lewanda Rife LPN on 45/40/9811   Method used:   Telephoned to ...       [facility]* (retail)       [address]       [place], [place]  [address]       Ph: [phone]       Fax: [phone]   RxID:   [id number]

## 2010-07-16 NOTE — Miscellaneous (Signed)
Summary: Controlled Substance Agreement  Controlled Substance Agreement   Imported By: Lanelle Bal 12/18/2009 11:08:59  _____________________________________________________________________  External Attachment:    Type:   Image     Comment:   External Document

## 2010-07-16 NOTE — Letter (Signed)
Summary: [facility]: Immunoassay Fecal Occult Blood (iFOB) Order Form  [place] [facility]  [address] [place], [place] [address]   Phone: [phone]  Fax: [phone]      [facility]: Immunoassay Fecal Occult Blood (iFOB) Order Form   Oct 16, 2009 MRN: [id number]   [facility] CLAPP [birth year]   Physicican Name:_____Tower____________________  Diagnosis Code:_______V76.51___________________      Judith Part MD

## 2010-07-16 NOTE — Letter (Signed)
Summary: Results Follow up Letter  [place] at [facility]  [address] [place], [place] [address]   Phone: [phone]  Fax: [phone]    10/01/2009 MRN: [id number]    [facility] [address] [place], [place]  [address]    Dear Regina Ruiz,  The following are the results of your recent test(s):  Test         Result    Pap Smear:        Normal _____  Not Normal _____ Comments: ______________________________________________________ Cholesterol: LDL(Bad cholesterol):         Your goal is less than:         HDL (Good cholesterol):       Your goal is more than: Comments:  ______________________________________________________ Mammogram:        Normal __X___  Not Normal _____ Comments:  Yearly follow up is recommended.   ___________________________________________________________________ Hemoccult:        Normal _____  Not normal _______ Comments:    _____________________________________________________________________ Other Tests:    We routinely do not discuss normal results over the telephone.  If you desire a copy of the results, or you have any questions about this information we can discuss them at your next office visit.   Sincerely,    Marne A. Milinda Antis, M.D.  MAT:lsf

## 2010-07-16 NOTE — Assessment & Plan Note (Signed)
Summary: cpx/alc   Vital Signs:  Patient profile:   55 year old female Height:      62.5 inches Weight:      126.25 pounds BMI:     22.81 Temp:     97.7 degrees F oral Pulse rate:   80 / minute Pulse rhythm:   regular BP sitting:   122 / 78  (left arm) Cuff size:   regular  Vitals Entered By: Lewanda Rife LPN (September 21, 1608 9:21 AM) CC: complete physical with pap and breast exam LMP 5 yrs ago   History of Present Illness: here for health mt exam  wt is down 12 lb -- bmi 22-- likely due to thyroid change  not a big eater -- less of an appetitie  lots of stress   bp good 122/78  hx of copd and smoking  is taking chantix -- and has cut down significantly - now off of it  is getting ready to quit   thyroid-- did dec synthroid - Dr Kerrie Pleasure -- is feeling better / has labs to scan   osteopenia in past - mild 07 ca and D-- is taking that regularly   mam 07-- goes to [place]  self exam - no lumps or changes (had her breast exam at last visit )   Td 07  pap 07  -- wants to do that no menses in years   pneumovax in 04     more anx on more xanax needs something   not interested in colonosc yet    Allergies: 1)  Fiorinal (Butalbital-Aspirin-Caffeine) 2)  Demerol (Meperidine Hcl)  Past History:  Past Medical History: Last updated: 10/07/2007 Anxiety C O P D Depression Hyperthyroidism smoking  Past Surgical History: Last updated: 08/30/2008 D&C FNA thyroid (adenoma) endometrial by (-), 11/03 dexa, mild osterpenia 11/07 CXR (3/10)- fibrotic change, and pulmonary nodules  Family History: Last updated: 10/07/2007 Father: lung ca, brain ca  Mother:  Siblings: sister breast ca MGF cva son, congenital hip disease GF cva distant M relatives- ? heart problems  Social History: Last updated: 07/06/2009 married 1 child Patient currently smokes.  husband is chronically ill with cva and alcoholism  Risk Factors: Smoking Status: current  (09/01/2006)  Review of Systems General:  Complains of fatigue; denies chills, fever, loss of appetite, malaise, and sleep disorder. Eyes:  Denies blurring and eye irritation. CV:  Denies chest pain or discomfort, palpitations, and shortness of breath with exertion. Resp:  Denies cough, shortness of breath, and wheezing. GI:  Denies abdominal pain, change in bowel habits, indigestion, nausea, and vomiting. GU:  Denies hematuria and urinary frequency. MS:  Denies joint pain, joint redness, joint swelling, and cramps. Derm:  Denies dryness, itching, lesion(s), poor wound healing, and rash. Neuro:  Denies headaches, numbness, tingling, and tremors. Psych:  Complains of anxiety; denies depression, panic attacks, sense of great danger, and suicidal thoughts/plans. Endo:  Complains of weight change; denies cold intolerance, excessive thirst, and excessive urination. Heme:  Denies abnormal bruising and bleeding.  Physical Exam  General:  Well-developed,well-nourished,in no acute distress; alert,appropriate and cooperative throughout examination Head:  normocephalic, atraumatic, and no abnormalities observed.   Eyes:  vision grossly intact, pupils equal, pupils round, and pupils reactive to light.  no conjunctival pallor, injection or icterus  Ears:  R ear normal and L ear normal.   Nose:  no nasal discharge.   Mouth:  pharynx pink and moist.   Neck:  supple / no bruit or JVD  goiter is baseline Chest Wall:  No deformities, masses, or tenderness noted. Breasts:  No mass, nodules, thickening, tenderness, bulging, retraction, inflamation, nipple discharge or skin changes noted.   Lungs:  diffusely distant bs  no rales /rhonchi or wheeze  Heart:  Normal rate and regular rhythm. S1 and S2 normal without gallop, murmur, click, rub or other extra sounds. Abdomen:  Bowel sounds positive,abdomen soft and non-tender without masses, organomegaly or hernias noted. no renal bruit  Genitalia:  Normal  introitus for age, no external lesions, no vaginal discharge, mucosa pink and moist, no vaginal or cervical lesions, no vaginal atrophy, no friaility or hemorrhage, normal uterus size and position, no adnexal masses or tenderness Msk:  No deformity or scoliosis noted of thoracic or lumbar spine.  no acute joint changes  Pulses:  R and L carotid,radial,femoral,dorsalis pedis and posterior tibial pulses are full and equal bilaterally Extremities:  No clubbing, cyanosis, edema, or deformity noted with normal full range of motion of all joints.   Neurologic:  sensation intact to light touch, gait normal, and DTRs symmetrical and normal.   Skin:  Intact without suspicious lesions or rashes lentigos diffusely  Cervical Nodes:  No lymphadenopathy noted Axillary Nodes:  No palpable lymphadenopathy Inguinal Nodes:  No significant adenopathy Psych:  normal affect, talkative and pleasant  seems mildly anxious- but eager to talk about stressors with good eye contact not seemingly depressed    Impression & Recommendations:  Problem # 1:  HEALTH MAINTENANCE EXAM (ICD-V70.0) Assessment Comment Only reviewed health habits including diet, exercise and skin cancer prevention reviewed health maintenance list and family history  lab today disc need for smoking cessation stool immunoassay for colon screen-will consider colonosc in future  Orders: Venipuncture (16109) TLB-Lipid Panel (80061-LIPID) TLB-BMP (Basic Metabolic Panel-BMET) (80048-METABOL) TLB-CBC Platelet - w/Differential (85025-CBCD) TLB-Hepatic/Liver Function Pnl (80076-HEPATIC) T-Vitamin D (25-Hydroxy) (60454-09811)  Problem # 2:  ROUTINE GYNECOLOGICAL EXAMINATION (ICD-V72.31) Assessment: Comment Only annual exam with pap - menop  Problem # 3:  OTHER SCREENING MAMMOGRAM (ICD-V76.12) Assessment: Comment Only annual mammogram scheduled adv pt to continue regular self breast exams non remarkable breast exam today  Orders: Radiology  Referral (Radiology)  Problem # 4:  ANXIETY (ICD-300.00) Assessment: Deteriorated will try buspar for worsening anx -- and update  try to minimize alprazolam counseling would be helpful in future if she could afford it  much stress with alcoholic disabled husband Her updated medication list for this problem includes:    Alprazolam 0.25 Mg Tabs (Alprazolam) .[name]... 1/2 - 1 two times a day as needed    Buspirone Hcl 15 Mg Tabs (Buspirone hcl) .[name]... 1/2 by mouth two times a day for anxiety  Problem # 5:  OSTEOPENIA (ICD-733.90) Assessment: Unchanged due for dexa risk factors incl smoking and hyperthyroid  rev ca and D check D level  Orders: Venipuncture (91478) TLB-Lipid Panel (80061-LIPID) TLB-BMP (Basic Metabolic Panel-BMET) (80048-METABOL) TLB-CBC Platelet - w/Differential (85025-CBCD) TLB-Hepatic/Liver Function Pnl (80076-HEPATIC) T-Vitamin D (25-Hydroxy) (29562-13086) Specimen Handling (57846) Radiology Referral (Radiology)  Problem # 6:  HYPERTHYROIDISM (ICD-242.90) Assessment: Deteriorated managed by Dr Patrecia Pace recently wt loss- likely due to this and anx- but will update if this continues  Her updated medication list for this problem includes:    Propranolol Hcl 10 Mg Tabs (Propranolol hcl) ..... Once daily as needed  Problem # 7:  CIGARETTE SMOKER (ICD-305.1) Assessment: Improved commended on cutting down still working on quitting discussed in detail risks of smoking, and possible outcomes including COPD, vascular dz, cancer and  also respiratory infections/sinus problems  pt also has mild copd update pneumovax today  Complete Medication List: 1)  Alprazolam 0.25 Mg Tabs (Alprazolam) .... 1/2 - 1 two times a day as needed 2)  Propranolol Hcl 10 Mg Tabs (Propranolol hcl) .... Once daily as needed 3)  Calcium + D Ultra 1000 Mg  .... Two times a day 4)  Vit D  .... Once daily 5)  Elocon 0.1 % Crea (Mometasone furoate) .... Apply to affected area once daily as  needed 6)  Tessalon 200 Mg Caps (Benzonatate) .[name].. 1 by mouth three times a day as needed cough 7)  Synthroid 100 Mcg Tabs (Levothyroxine sodium) .... Take 1 tablet by mouth once a day 8)  Buspirone Hcl 15 Mg Tabs (Buspirone hcl) .... 1/2 by mouth two times a day for anxiety  Other Orders: Pneumococcal Vaccine (16109) Admin 1st Vaccine (60454)  Patient Instructions: 1)  start on buspar for anxiety-and minimize xanax as much as possible 2)  update me if side eff or if you feel worse 3)  pneumovax today 4)  keep working on quitting smoking  5)  we will schedule mammogram and dexa at check out  Prescriptions: BUSPIRONE HCL 15 MG TABS (BUSPIRONE HCL) 1/2 by mouth two times a day for anxiety  #30 x 11   Entered and Authorized by:   Judith Part MD   Signed by:   Judith Part MD on 09/21/2009   Method used:   Print then Give to Patient   RxID:   [id number]   Current Allergies (reviewed today): FIORINAL (BUTALBITAL-ASPIRIN-CAFFEINE) DEMEROL (MEPERIDINE HCL)    Immunizations Administered:  Pneumonia Vaccine:    Vaccine Type: Pneumovax    Site: left deltoid    Mfr: Merck    Dose: 0.5 ml    Route: IM    Given by: Lewanda Rife LPN    Exp. Date: 01/28/2011    Lot #: [id number]    VIS given: 01/12/96 version given September 21, 2009.

## 2010-08-02 ENCOUNTER — Telehealth: Payer: Self-pay | Admitting: Family Medicine

## 2010-08-07 NOTE — Progress Notes (Signed)
Summary: alprazolam   Phone Note Refill Request Message from:  Patient on August 02, 2010 9:02 AM  Refills Requested: Medication #1:  ALPRAZOLAM 0.25 MG  TABS 1/2 - 1 two times a day as needed Patient will no longer be using [facility]. [facility] [address]   Initial call taken by: Melody Comas,  August 02, 2010 9:02 AM  Follow-up for Phone Call        px written on EMR for call in  Follow-up by: Judith Part MD,  August 02, 2010 9:48 AM  Additional Follow-up for Phone Call Additional follow up Details #1::        Rx phoned to pharmacy. Additional Follow-up by: Melody Comas,  August 02, 2010 10:16 AM    Prescriptions: ALPRAZOLAM 0.25 MG  TABS (ALPRAZOLAM) 1/2 - 1 two times a day as needed  #60 x 0   Entered and Authorized by:   Judith Part MD   Signed by:   Judith Part MD on 08/02/2010   Method used:   Telephoned to ...       [facility]  [address] [id number]       [address]       [place], [place]  [address]       Ph: [phone] or [phone]       Fax: [phone]   RxID:   [id number]

## 2010-08-14 ENCOUNTER — Encounter: Payer: Self-pay | Admitting: Family Medicine

## 2010-08-27 NOTE — Letter (Signed)
Summary: [facility]   Imported By: Kassie Mends 08/19/2010 10:51:27  _____________________________________________________________________  External Attachment:    Type:   Image     Comment:   External Document

## 2010-09-09 ENCOUNTER — Encounter: Payer: Self-pay | Admitting: Family Medicine

## 2010-09-09 ENCOUNTER — Ambulatory Visit (INDEPENDENT_AMBULATORY_CARE_PROVIDER_SITE_OTHER): Payer: Self-pay | Admitting: Family Medicine

## 2010-09-09 DIAGNOSIS — M5430 Sciatica, unspecified side: Secondary | ICD-10-CM

## 2010-09-09 DIAGNOSIS — M7062 Trochanteric bursitis, left hip: Secondary | ICD-10-CM

## 2010-09-09 DIAGNOSIS — M5432 Sciatica, left side: Secondary | ICD-10-CM

## 2010-09-09 DIAGNOSIS — M76899 Other specified enthesopathies of unspecified lower limb, excluding foot: Secondary | ICD-10-CM

## 2010-09-09 MED ORDER — CYCLOBENZAPRINE HCL 10 MG PO TABS: 10.0000 mg | ORAL_TABLET | Freq: Three times a day (TID) | ORAL | Status: DC | PRN

## 2010-09-09 MED ORDER — HYDROCODONE-ACETAMINOPHEN 5-500 MG PO TABS: 2.0000 | ORAL_TABLET | Freq: Four times a day (QID) | ORAL | Status: DC | PRN

## 2010-09-09 MED ORDER — DICLOFENAC SODIUM 75 MG PO TBEC: 75.0000 mg | DELAYED_RELEASE_TABLET | Freq: Two times a day (BID) | ORAL | Status: DC

## 2010-09-09 NOTE — Progress Notes (Signed)
56 year old female with L hip pain:  L sided pain, with no inducing injury Started last night about five at night Having trouble sitting - has had sciatica  Not below the knee No numbness  No bowel or bladder incontinence.  Works in collections Sitting  No groin pain Some anterior hip and GTB pain  REVIEW OF SYSTEMS  GEN: No fevers, chills. Nontoxic. Primarily MSK c/o today. MSK: Detailed in the HPI GI: tolerating PO intake without difficulty Neuro: as above, no radiculopathy below knee Otherwise the pertinent positives of the ROS are noted above.    The PMH, PSH, Social History, Family History, Medications, and allergies have been reviewed in [facility], and have been updated if relevant.   Gen: Well-developed,well-nourished,in no acute distress; alert,appropriate and cooperative throughout examination HEENT: Normocephalic and atraumatic without obvious abnormalities.  Ears, externally no deformities Pulm: Breathing comfortably in no respiratory distress Range of motion at  the waist: Flexion: mild pain Extension: mild pain Rotation: mild pain  No echymosis or edema Rises to examination table with mild difficulty Gait: minimally antalgic  Inspection/Deformity: N Paraspinus T: Y  B Ankle Dorsiflexion (L5,4): 5/5 B Great Toe Dorsiflexion (L5,4): 5/5 Heel Walk (L5): WNL Toe Walk (S1): WNL Rise/Squat (L4): WNL, mild pain  SENSORY B Medial Foot (L4): WNL B Dorsum (L5): WNL B Lateral (S1): WNL Light Touch: WNL Pinprick: WNL  REFLEXES Knee (L4): 2+ Ankle (S1): 2+  L GTB TTP TTP ANTERIOR GLUTE MEDIUS INSERTION B SLR, seated: neg B SLR, supine: neg B FABER: neg B Reverse FABER: pos B Greater Troch: NT B Log Roll: neg B Stork: NT B Sciatic Notch: mild TTP Leg Lengths: equal

## 2010-09-09 NOTE — Patient Instructions (Signed)
Joint Mobilization and Rehab 2. hip abductor rotations. standing, hip flexion and rotation outward then inward. 3 sets, 15 reps. when can do comfortably, add ankle weights starting at 2 pounds.  3. cross over stretching - shoulder back to ground, same side leg crossover. 3-5 sets for 30 min..  4. rolling up and back knees to chest and rocking.  F/u 3 weeks if not better

## 2010-09-09 NOTE — Assessment & Plan Note (Signed)
Combination multiple probs Suspect mild sciatica with GTB, insertional tendinopathy anterior glue medius vs associated bursitis in this area. No true hip pathology.  meds and hip below F/u 3 weeks if not better

## 2010-09-13 ENCOUNTER — Emergency Department (HOSPITAL_COMMUNITY)
Admission: EM | Admit: 2010-09-13 | Discharge: 2010-09-13 | Disposition: A | Discharge status: Home / Self Care | Payer: BC Managed Care – PPO | Attending: Emergency Medicine | Admitting: Emergency Medicine

## 2010-09-13 ENCOUNTER — Emergency Department (HOSPITAL_COMMUNITY): Payer: BC Managed Care – PPO

## 2010-09-13 DIAGNOSIS — R197 Diarrhea, unspecified: Secondary | ICD-10-CM | POA: Insufficient documentation

## 2010-09-13 DIAGNOSIS — N938 Other specified abnormal uterine and vaginal bleeding: Secondary | ICD-10-CM | POA: Insufficient documentation

## 2010-09-13 DIAGNOSIS — R110 Nausea: Secondary | ICD-10-CM | POA: Insufficient documentation

## 2010-09-13 DIAGNOSIS — K5289 Other specified noninfective gastroenteritis and colitis: Secondary | ICD-10-CM | POA: Insufficient documentation

## 2010-09-13 DIAGNOSIS — N949 Unspecified condition associated with female genital organs and menstrual cycle: Secondary | ICD-10-CM | POA: Insufficient documentation

## 2010-09-13 DIAGNOSIS — R109 Unspecified abdominal pain: Secondary | ICD-10-CM | POA: Insufficient documentation

## 2010-09-13 LAB — URINALYSIS, ROUTINE W REFLEX MICROSCOPIC
Protein, ur: NEGATIVE mg/dL
Urobilinogen, UA: 0.2 mg/dL (ref 0.0–1.0)

## 2010-09-13 LAB — DIFFERENTIAL
Basophils Absolute: 0 10*3/uL (ref 0.0–0.1)
Basophils Relative: 0 % (ref 0–1)
Eosinophils Absolute: 0 10*3/uL (ref 0.0–0.7)
Monocytes Absolute: 1 10*3/uL (ref 0.1–1.0)
Monocytes Relative: 5 % (ref 3–12)

## 2010-09-13 LAB — COMPREHENSIVE METABOLIC PANEL
AST: 20 U/L (ref 0–37)
BUN: 9 mg/dL (ref 6–23)
CO2: 29 mEq/L (ref 19–32)
Chloride: 105 mEq/L (ref 96–112)
Creatinine, Ser: 0.9 mg/dL (ref 0.4–1.2)
GFR calc non Af Amer: >60 mL/min (ref >60)
Glucose, Bld: 100 mg/dL — ABNORMAL HIGH (ref 70–99)
Total Bilirubin: 0.8 mg/dL (ref 0.3–1.2)

## 2010-09-13 LAB — CBC
MCH: 31.2 pg (ref 26.0–34.0)
MCHC: 35.2 g/dL (ref 30.0–36.0)
Platelets: 256 10*3/uL (ref 150–400)
RDW: 12.2 % (ref 11.5–15.5)

## 2010-09-13 LAB — URINE MICROSCOPIC-ADD ON

## 2010-09-13 LAB — GC/CHLAMYDIA PROBE AMP, GENITAL: Chlamydia, DNA Probe: NEGATIVE

## 2010-09-13 LAB — WET PREP, GENITAL: Yeast Wet Prep HPF POC: NONE SEEN

## 2010-09-13 LAB — LIPASE, BLOOD: Lipase: 17 U/L (ref 11–59)

## 2010-09-13 MED ORDER — IOHEXOL 300 MG/ML  SOLN
80.0000 mL | Freq: Once | INTRAMUSCULAR | Status: AC | PRN
  Administered 2010-09-13: 80 mL via INTRAVENOUS

## 2010-09-16 ENCOUNTER — Telehealth: Payer: Self-pay

## 2010-09-17 ENCOUNTER — Encounter: Payer: Self-pay | Admitting: Family Medicine

## 2010-09-17 ENCOUNTER — Ambulatory Visit (INDEPENDENT_AMBULATORY_CARE_PROVIDER_SITE_OTHER): Payer: BC Managed Care – PPO | Admitting: Gynecology

## 2010-09-17 ENCOUNTER — Ambulatory Visit (INDEPENDENT_AMBULATORY_CARE_PROVIDER_SITE_OTHER): Payer: BC Managed Care – PPO | Admitting: Family Medicine

## 2010-09-17 DIAGNOSIS — K529 Noninfective gastroenteritis and colitis, unspecified: Secondary | ICD-10-CM

## 2010-09-17 DIAGNOSIS — K5289 Other specified noninfective gastroenteritis and colitis: Secondary | ICD-10-CM

## 2010-09-17 DIAGNOSIS — N950 Postmenopausal bleeding: Secondary | ICD-10-CM

## 2010-09-17 DIAGNOSIS — N952 Postmenopausal atrophic vaginitis: Secondary | ICD-10-CM

## 2010-09-17 NOTE — Assessment & Plan Note (Signed)
New with slt thickened endometrium on hospital pelvic US  Ref to gyn - poss endomet bx  Lots of stress lately  menop for 5 years  Now bleeding has stopped
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
## 2010-09-17 NOTE — Patient Instructions (Signed)
Finish cipro and flagyl - as directed until done  Keep up fluid intake  Update me if more pain or bleeding  We will do GI and gyn referrals at check out

## 2010-09-17 NOTE — Assessment & Plan Note (Signed)
Pain and rectal bleeding stopped with cipro and flagyl Will finish this  ? etiol New on CT in ER 3/30  Ref to GI Her last nl colonosc was 08 Lots of stress lately

## 2010-09-27 NOTE — Telephone Encounter (Signed)
OPENED IN ERROR

## 2010-10-09 ENCOUNTER — Other Ambulatory Visit: Payer: BC Managed Care – PPO

## 2010-10-09 ENCOUNTER — Ambulatory Visit (INDEPENDENT_AMBULATORY_CARE_PROVIDER_SITE_OTHER): Payer: BC Managed Care – PPO | Admitting: Gynecology

## 2010-10-09 DIAGNOSIS — N950 Postmenopausal bleeding: Secondary | ICD-10-CM

## 2010-10-09 DIAGNOSIS — N83339 Acquired atrophy of ovary and fallopian tube, unspecified side: Secondary | ICD-10-CM

## 2010-10-31 ENCOUNTER — Telehealth: Payer: Self-pay | Admitting: Family Medicine

## 2010-10-31 DIAGNOSIS — M949 Disorder of cartilage, unspecified: Secondary | ICD-10-CM

## 2010-10-31 DIAGNOSIS — M899 Disorder of bone, unspecified: Secondary | ICD-10-CM

## 2010-10-31 DIAGNOSIS — E559 Vitamin D deficiency, unspecified: Secondary | ICD-10-CM

## 2010-10-31 DIAGNOSIS — Z0000 Encounter for general adult medical examination without abnormal findings: Secondary | ICD-10-CM | POA: Insufficient documentation

## 2010-10-31 NOTE — Telephone Encounter (Signed)
Message copied by Roxy Manns on Thu Oct 31, 2010 10:53 PM ------      Message from: Liane Comber      Created: Thu Oct 31, 2010 10:49 AM      Regarding: Cpx labs mon       Please order  future cpx labs for pt's upcomming lab appt.      Thanks      Rodney Booze

## 2010-11-04 ENCOUNTER — Other Ambulatory Visit (INDEPENDENT_AMBULATORY_CARE_PROVIDER_SITE_OTHER): Payer: BC Managed Care – PPO | Admitting: Family Medicine

## 2010-11-04 DIAGNOSIS — M899 Disorder of bone, unspecified: Secondary | ICD-10-CM

## 2010-11-04 DIAGNOSIS — M949 Disorder of cartilage, unspecified: Secondary | ICD-10-CM

## 2010-11-04 DIAGNOSIS — Z0000 Encounter for general adult medical examination without abnormal findings: Secondary | ICD-10-CM

## 2010-11-04 DIAGNOSIS — E559 Vitamin D deficiency, unspecified: Secondary | ICD-10-CM

## 2010-11-04 LAB — LIPID PANEL
HDL: 48.7 mg/dL (ref >39.00)
Total CHOL/HDL Ratio: 3
Triglycerides: 82 mg/dL (ref 0.0–149.0)

## 2010-11-06 ENCOUNTER — Ambulatory Visit (INDEPENDENT_AMBULATORY_CARE_PROVIDER_SITE_OTHER): Payer: BC Managed Care – PPO | Admitting: Family Medicine

## 2010-11-06 ENCOUNTER — Encounter: Payer: Self-pay | Admitting: Family Medicine

## 2010-11-06 DIAGNOSIS — Z0000 Encounter for general adult medical examination without abnormal findings: Secondary | ICD-10-CM

## 2010-11-06 DIAGNOSIS — N950 Postmenopausal bleeding: Secondary | ICD-10-CM

## 2010-11-06 DIAGNOSIS — M949 Disorder of cartilage, unspecified: Secondary | ICD-10-CM

## 2010-11-06 DIAGNOSIS — M899 Disorder of bone, unspecified: Secondary | ICD-10-CM

## 2010-11-06 DIAGNOSIS — F17200 Nicotine dependence, unspecified, uncomplicated: Secondary | ICD-10-CM

## 2010-11-06 DIAGNOSIS — Z23 Encounter for immunization: Secondary | ICD-10-CM

## 2010-11-06 DIAGNOSIS — E559 Vitamin D deficiency, unspecified: Secondary | ICD-10-CM

## 2010-11-06 LAB — COMPREHENSIVE METABOLIC PANEL
BUN: 14 mg/dL (ref 6–23)
CO2: 28 mEq/L (ref 19–32)
Calcium: 9 mg/dL (ref 8.4–10.5)
Chloride: 100 mEq/L (ref 96–112)
Creatinine, Ser: 1 mg/dL (ref 0.4–1.2)
GFR: 61.55 mL/min (ref >60.00)

## 2010-11-06 NOTE — Assessment & Plan Note (Signed)
Reviewed health habits including diet and exercise and skin cancer prevention Also reviewed health mt list, fam hx and immunizations  Rev wellness labs Working on smoking cess Pt will make own mam report

## 2010-11-06 NOTE — Assessment & Plan Note (Signed)
For hystosalpingogram and endo bx soon with gyn Last pap nl

## 2010-11-06 NOTE — Assessment & Plan Note (Signed)
Disc in detail risks of smoking and possible outcomes including copd, vascular/ heart disease, cancer , respiratory and sinus infections  Pt voices understanding  Is working on cutting down  Plans to quit by sept

## 2010-11-06 NOTE — Assessment & Plan Note (Signed)
Due for dexa Pt will schedule  Enc to start back on vit D 2000 iu daily  Disc exercise Disc smoking cess Year 2 of fosamax with no problems

## 2010-11-06 NOTE — Progress Notes (Signed)
Subjective:    Patient ID: Regina Ruiz, female    DOB: 10/23/1955, 55 y.o.   MRN: [id number]  HPI Here for annual health mt exam and also to review chronic med problems  Feels great - no more colon issues  Sees Dr Juanda Chance in June for f/u   Gyn did pap- Dr Audie Box  Had some vag bleed- could not do endo bx - cervical stenosis/ using a cream and will re attempt it (with hystosalpingogram)  Pap nl in April -- was normal  mammo -- due - will schedule her own  Self exam - no lumps  utd on breast exam in April   Is dating again- very happy  Is happy with her weight  No contact with her Ex at all   Will disc colonosc with Dr Juanda Chance  Flu shot-- got it in fall at work  Td-was 07 Pneumovax- 04- due for (will get that today)- at inc risk of pneumonia due to smoking   Smoking  Down to 10 cig per day - gradually cutting back  Using elect cigarette occas  Proud of that  Working at end of aug for quit date    Osteopenia dexa 07- due for one  Vit D theraputic at 32 Not taking it currently     Lipids very good Eating a very healthy diet - better than she used to  No more fried foods  Lab Results  Component Value Date   CHOL 128 11/04/2010   CHOL 149 09/21/2009   Lab Results  Component Value Date   HDL 48.70 11/04/2010   HDL 40.98 09/21/2009   Lab Results  Component Value Date   LDLCALC 63 11/04/2010   LDLCALC 85 09/21/2009   Lab Results  Component Value Date   TRIG 82.0 11/04/2010   TRIG 92.0 09/21/2009   Lab Results  Component Value Date   CHOLHDL 3 11/04/2010   CHOLHDL 3 09/21/2009   No results found for this basename: LDLDIRECT     Hypothyroid Lab Results  Component Value Date   TSH 3.24 07/25/2008    Past Medical History  Diagnosis Date  . Anxiety   . COPD (chronic obstructive pulmonary disease)   . Depression   . Thyroid disease     Past Surgical History  Procedure Date  . Dilation and curettage of uterus     History   Social History  . Marital Status:  Married    Spouse Name: N/A    Number of Children: 1  . Years of Education: N/A   Occupational History  . Not on file.   Social History Main Topics  . Smoking status: Current Everyday Smoker -- 0.5 packs/day  . Smokeless tobacco: Not on file  . Alcohol Use: Not on file  . Drug Use: Not on file  . Sexually Active: Not on file   Other Topics Concern  . Not on file   Social History Narrative   Husband is chronically ill with CVA and alcoholism---she left him in fall of 2011    Family History  Problem Relation Age of Onset  . Cancer Father     lung cancer and brain cancer  . Cancer Sister     breast cancer  . Stroke Maternal Grandfather   . Stroke Paternal Grandfather      Review of Systems Review of Systems  Constitutional: Negative for fever, appetite change, fatigue and unexpected weight change.  Eyes: Negative for pain and visual disturbance.  Respiratory: Negative for  cough and shortness of breath.   Cardiovascular: Negative.for cp or sob or edema    Gastrointestinal: Negative for nausea, diarrhea and constipation.  Genitourinary: Negative for urgency and frequency.  Skin: Negative for pallor. or rash  Neurological: Negative for weakness, light-headedness, numbness and headaches.  Hematological: Negative for adenopathy. Does not bruise/bleed easily.  Psychiatric/Behavioral: Negative for dysphoric mood. The patient is not nervous/anxious.          Objective:   Physical Exam  Constitutional: She appears well-developed and well-nourished.  HENT:  Head: Normocephalic and atraumatic.  Right Ear: External ear normal.  Left Ear: External ear normal.  Nose: Nose normal.  Mouth/Throat: Oropharynx is clear and moist.  Eyes: Conjunctivae and EOM are normal. Pupils are equal, round, and reactive to light.  Neck: Normal range of motion. Neck supple. No JVD present. Carotid bruit is not present. No thyromegaly present.  Cardiovascular: Normal rate, regular rhythm, normal  heart sounds and intact distal pulses.   Pulmonary/Chest: Effort normal and breath sounds normal. No respiratory distress. She has no wheezes. She has no rales.       Diffusely distant bs   Abdominal: Soft. Bowel sounds are normal. She exhibits no distension and no mass. There is no tenderness.  Musculoskeletal: Normal range of motion. She exhibits no edema and no tenderness.  Lymphadenopathy:    She has no cervical adenopathy.  Neurological: She is alert. She has normal reflexes. No cranial nerve deficit. Coordination normal.  Skin: Skin is warm and dry. No rash noted. No erythema. No pallor.  Psychiatric: She has a normal mood and affect.          Assessment & Plan:

## 2010-11-06 NOTE — Patient Instructions (Signed)
Increase your vitamin D to 2000 iu daily  (vitamin D3) Pneumonia vaccine today Keep working on quitting smoking  Don't forget to schedule your mammogram and bone density test -and have results sent to me  Keep up healthy diet and exercise

## 2010-11-12 ENCOUNTER — Other Ambulatory Visit: Payer: BC Managed Care – PPO

## 2010-11-12 ENCOUNTER — Ambulatory Visit (INDEPENDENT_AMBULATORY_CARE_PROVIDER_SITE_OTHER): Payer: BC Managed Care – PPO | Admitting: Gynecology

## 2010-11-12 ENCOUNTER — Other Ambulatory Visit: Payer: Self-pay | Admitting: Gynecology

## 2010-11-12 DIAGNOSIS — N840 Polyp of corpus uteri: Secondary | ICD-10-CM

## 2010-11-12 DIAGNOSIS — N950 Postmenopausal bleeding: Secondary | ICD-10-CM

## 2010-11-12 DIAGNOSIS — N83339 Acquired atrophy of ovary and fallopian tube, unspecified side: Secondary | ICD-10-CM

## 2010-11-12 DIAGNOSIS — N882 Stricture and stenosis of cervix uteri: Secondary | ICD-10-CM

## 2010-11-15 HISTORY — PX: OTHER SURGICAL HISTORY: SHX169

## 2010-11-15 HISTORY — PX: ENDOMETRIAL BIOPSY: SHX622

## 2010-11-18 NOTE — H&P (Signed)
  Regina Ruiz, Regina Ruiz                 ACCOUNT NO.:  [id number]  MEDICAL RECORD NO.:  [id number]           PATIENT TYPE:  E  LOCATION:  [facility]                         FACILITY:  [facility]  PHYSICIAN:  Ruhaan Nordahl P. Deva Ron, M.D.DATE OF BIRTH:  Nov 07, 1955  DATE OF ADMISSION:  09/13/2010 DATE OF DISCHARGE:  09/13/2010                             HISTORY & PHYSICAL   CHIEF COMPLAINT:  Vaginal bleeding.  HISTORY OF PRESENT ILLNESS:  The patient is a 55 year old G3, P1, AB2 female with history of vaginal bleeding who underwent ultrasound that showed a thickened endometrium.  She ultimately underwent a sonohysterogram which showed an endometrial polyp and she is admitted for hysteroscopy, D and C, removal of the polyp.  Of note, the patient does have a history of stenotic cervix.  She required vaginal estrogen treatment pre sonohysterogram to allow for cervical dilatation for the catheter.  She has continued on the estrogen through the hysteroscopy D and C.  PAST MEDICAL HISTORY:  Hypothyroidism, colitis.  PAST SURGICAL HISTORY:  None.  ALLERGIES:  ETHAMBUTOL, MEPERIDINE  CURRENT MEDICATIONS:  Synthroid.  REVIEW OF SYSTEMS:  Noncontributory.  FAMILY HISTORY:  Noncontributory.  SOCIAL HISTORY:  Noncontributory.  PHYSICAL EXAMINATION:  VITAL SIGNS:  Afebrile.  Vital signs are stable. HEENT:  Normal. LUNGS:  Clear. CARDIAC:  Regular rate without rubs, murmurs or gallops. ABDOMEN:  Exam benign. PELVIC:  External BUS, vagina with atrophic genital changes.  Cervix grossly normal, stenotic os.  Bimanual, uterus normal size, midline, mobile, nontender.  Adnexa without masses or tenderness.  ASSESSMENT:  This is a 55 year old female with postmenopausal bleeding, sonohysterogram with endometrial polyp for hysteroscopy D and C.  I reviewed the proposed surgery and what is involved with the procedure to include the use of the hysteroscope, resectoscope and D and C portion of the procedure.  She  understands the issues particularly with the cervical stenosis and the risks realistically of perforation, possible cancelling of the case, possible injury to internal organs either immediately recognized or delayed recognized such as bowel, bladder, ureters, vessels or nerves necessitating major exploratory reparative surgeries and future reparative surgeries including bladder repair, ureteral damage repair, bowel repair, ostomy formation; all discussed, understood and accepted.  The risk of infection requiring prolonged antibiotics as well as the risk of hemorrhage necessitating transfusion and the risks of transfusion including transfusion reaction, hepatitis, HIV, mad cow disease and other unknown entities was reviewed with her. The use of distended media and the possible absorption leading to metabolic complications such as common seizures was also reviewed with her.  Again she has been instructed to continue on her estrogen vaginal treatment to aid in cervical dilatation as well as she will receive Cytotec vaginally the night before the procedure     Abisai Coble P. Audie Box, M.D.     TPF/MEDQ  D:  11/15/2010  T:  11/15/2010  Job:  [id number]  Electronically Signed by Colin Broach M.D. on 11/18/2010 09:23:14 AM

## 2010-11-19 ENCOUNTER — Encounter: Payer: Self-pay | Admitting: Family Medicine

## 2010-11-20 ENCOUNTER — Ambulatory Visit (INDEPENDENT_AMBULATORY_CARE_PROVIDER_SITE_OTHER): Payer: BC Managed Care – PPO | Admitting: Internal Medicine

## 2010-11-20 ENCOUNTER — Encounter: Payer: Self-pay | Admitting: Internal Medicine

## 2010-11-20 VITALS — BP 108/66 | HR 88 | Ht 63.0 in | Wt 120.0 lb

## 2010-11-20 DIAGNOSIS — K5289 Other specified noninfective gastroenteritis and colitis: Secondary | ICD-10-CM

## 2010-11-20 DIAGNOSIS — R933 Abnormal findings on diagnostic imaging of other parts of digestive tract: Secondary | ICD-10-CM

## 2010-11-20 MED ORDER — PEG-KCL-NACL-NASULF-NA ASC-C 100 G PO SOLR: 1.0000 | Freq: Once | ORAL | Status: DC

## 2010-11-20 NOTE — Progress Notes (Signed)
Sweden Lesure [birth year] MRN [id number]        History of Present Illness:  This is a 55 year old white female with a recent episode of crampy abdominal pain and rectal bleeding evaluated in the emergency room  on 09/13/2010. A CT scan of the abdomen showed diffuse edema of the transverse colon, splenic flexure and sigmoid colon consistent with acute colitis. She was treated with Cipro and  Flagyl and bowel rest with complete resolution of her symptoms. She is now well with normal bowel habits, no rectal bleeding or abdominal pain. She has never had a colonoscopy. There is no family history of colon cancer. We apparently saw her about 25 years ago for upper GI symptoms when she was evaluated with upper endoscopy. Those records are not available. She is a smoker. She denies taking antihypertensive medications or migraine medications.   Past Medical History  Diagnosis Date  . Anxiety   . COPD (chronic obstructive pulmonary disease)   . Depression   . Thyroid disease   . Osteopenia   . Colitis    Past Surgical History  Procedure Date  . Dilation and curettage of uterus   . Endometrial biopsy 6/12    Dr Audie Box- negative    reports that she has been smoking.  She does not have any smokeless tobacco history on file. She reports that she drinks alcohol. She reports that she does not use illicit drugs. family history includes Breast cancer in her sister; Cancer in her father; and Stroke in her maternal grandfather and paternal grandfather. Allergies  Allergen Reactions  . Butalbital-Aspirin-Caffeine     REACTION: unspecified  . Meperidine Hcl     REACTION: unspecified        Review of Systems: Denies dysphagia, odynophagia, shortness of breath or chest pain  The remainder of the 10  point ROS is negative except as outlined in H&P   Physical Exam: General appearance  Well developed, in no distress. Eyes- non icteric. HEENT nontraumatic, normocephalic. Mouth no lesions, tongue  papillated, no cheilosis. Neck supple without adenopathy, thyroid not enlarged, no carotid bruits, no JVD. Lungs Clear to auscultation bilaterally. Cor normal S1 normal S2, regular rhythm , no murmur,  quiet precordium. Abdomen soft relaxed abdomen with normal active bowel sounds. No organomegaly. No distention. Rectal: Soft Hemoccult negative stool. Extremities no pedal edema. Skin no lesions. Neurological alert and oriented x 3. Psychological normal mood and affect.  Assessment and Plan:  Problem #1 Status post an acute episode of ischemic colitis likely related to smoking. She is a good candidate for a screening colonoscopy at the age of 63. There are no risk factors. We have discussed colonoscopy and she will schedule it. I have discussed smoking being a factor in ischemic events.   11/20/2010 Lina Sar

## 2010-11-20 NOTE — Patient Instructions (Addendum)
You have been scheduled for a colonoscopy. Please follow written instructions given to you at your visit today.  Please pick up your Moviprep kit at the pharmacy within the next 2-3 days. CC:Dr Tower, Dr Patrecia Pace

## 2010-11-21 ENCOUNTER — Ambulatory Visit (HOSPITAL_BASED_OUTPATIENT_CLINIC_OR_DEPARTMENT_OTHER)
Admission: RE | Admit: 2010-11-21 | Discharge: 2010-11-21 | Disposition: A | Discharge status: Home / Self Care | Payer: BC Managed Care – PPO | Source: Ambulatory Visit | Attending: Gynecology | Admitting: Gynecology

## 2010-11-21 ENCOUNTER — Other Ambulatory Visit: Payer: Self-pay | Admitting: Gynecology

## 2010-11-21 DIAGNOSIS — Z01812 Encounter for preprocedural laboratory examination: Secondary | ICD-10-CM | POA: Insufficient documentation

## 2010-11-21 DIAGNOSIS — N898 Other specified noninflammatory disorders of vagina: Secondary | ICD-10-CM | POA: Insufficient documentation

## 2010-11-21 DIAGNOSIS — F17200 Nicotine dependence, unspecified, uncomplicated: Secondary | ICD-10-CM | POA: Insufficient documentation

## 2010-11-21 DIAGNOSIS — N840 Polyp of corpus uteri: Secondary | ICD-10-CM

## 2010-11-21 DIAGNOSIS — Z01810 Encounter for preprocedural cardiovascular examination: Secondary | ICD-10-CM | POA: Insufficient documentation

## 2010-11-21 HISTORY — PX: HYSTEROSCOPY: SHX211

## 2010-11-21 LAB — POCT HEMOGLOBIN-HEMACUE: Hemoglobin: 14.8 g/dL (ref 12.0–15.0)

## 2010-11-25 ENCOUNTER — Encounter: Payer: Self-pay | Admitting: Family Medicine

## 2010-11-27 ENCOUNTER — Other Ambulatory Visit: Payer: BC Managed Care – PPO | Admitting: Internal Medicine

## 2010-12-05 NOTE — Op Note (Signed)
  NAMECHAMIA, Regina Ruiz                 ACCOUNT NO.:  [id number]  MEDICAL RECORD NO.:  [id number]  LOCATION:  [facility]                         FACILITY:  [facility]  PHYSICIAN:  Bijan Ridgley P. Ayuub Penley, M.D.DATE OF BIRTH:  07-23-1955  DATE OF PROCEDURE:  11/21/2010 DATE OF DISCHARGE:  09/13/2010                              OPERATIVE REPORT   PREOPERATIVE DIAGNOSES:  Vaginal bleeding, endometrial polyp.  POSTOPERATIVE DIAGNOSES:  Vaginal bleeding, endometrial polyp.  PROCEDURES: 1. Hysteroscopy. 2. Endometrial polyp resection. 3. Dilatation and curettage.  SURGEON:  Alexandera Kuntzman P. Jacquelynne Guedes, M.D.  ANESTHETIC:  General.  ESTIMATED BLOOD LOSS:  Minimal.  COMPLICATIONS:  None.  Distending media discrepancy, minimal.  SPECIMEN: 1. Endometrial curetting 2. Endometrial polyp to pathology.  FINDINGS:  EUA, external BUS and vagina normal.  Cervix normal, bimanual.  Uterus anteverted, small, midline, mobile.  Adnexa without masses.  Hysteroscopic, endometrial polyp, lower posterior uterine segment resected in its entirety.  Endometrial cavity otherwise normal noting fundus at anterior-posterior uterine surfaces, lower uterine segment.  Endocervical canal, right and left tubal ostia all visualized.  PROCEDURE:  The patient was taken to the operating room, underwent general anesthesia, placed in low dorsal lithotomy position, received a perineal vaginal preparation with Betadine solution.  Bladder emptied with in-and-out Foley catheterization.  EUA performed.  The patient draped in usual fashion.  Cervix was visualized with a speculum. Anterior lip grasped with single-tooth tenaculum and a paracervical block using 1% lidocaine was placed, total of 10 cc.  Cervix was then gently gradually dilated to admit the operative hysteroscope and hysteroscopy was performed with findings noted above.  Using the right- angle resectoscope loop, the endometrial polyp was excised from its insertion to the  endometrium and was sent to pathology.  A sharp curettage was then performed.  Scant return was noted as an atrophic endometrial pattern was noted on hysteroscopy and this was sent to pathology.  Re-hysteroscopy showed an empty cavity, good distention, no evidence of perforation with no active bleeding.  The instruments were removed.  Hemostasis visualized at the tenaculum site and cervical os.  The patient placed in supine position, awakened without difficulty, taken to recovery room in good condition having received intraoperative Toradol.     [name] P. Audie Box, M.D.     TPF/MEDQ  D:  11/21/2010  T:  11/21/2010  Job:  [id number]  Electronically Signed by Colin Broach M.D. on 12/05/2010 09:16:00 AM

## 2010-12-06 ENCOUNTER — Ambulatory Visit (INDEPENDENT_AMBULATORY_CARE_PROVIDER_SITE_OTHER): Payer: BC Managed Care – PPO | Admitting: Gynecology

## 2010-12-06 DIAGNOSIS — Z9889 Other specified postprocedural states: Secondary | ICD-10-CM

## 2010-12-17 ENCOUNTER — Other Ambulatory Visit: Payer: Self-pay | Admitting: *Deleted

## 2010-12-17 MED ORDER — ALPRAZOLAM 0.25 MG PO TABS: 0.2500 mg | ORAL_TABLET | Freq: Two times a day (BID) | ORAL | Status: DC | PRN

## 2010-12-17 NOTE — Telephone Encounter (Signed)
Px written for call in   

## 2010-12-17 NOTE — Telephone Encounter (Signed)
Medication phoned to  [facility] [address] [facility] as instructed.  

## 2010-12-17 NOTE — Telephone Encounter (Signed)
Ok to refill xanax 

## 2010-12-19 ENCOUNTER — Encounter: Payer: Self-pay | Admitting: Family Medicine

## 2011-01-09 ENCOUNTER — Other Ambulatory Visit: Payer: Self-pay | Admitting: Family Medicine

## 2011-01-17 ENCOUNTER — Other Ambulatory Visit: Payer: Self-pay | Admitting: *Deleted

## 2011-01-17 MED ORDER — ALPRAZOLAM 0.25 MG PO TABS: ORAL_TABLET | ORAL | Status: DC

## 2011-01-17 NOTE — Telephone Encounter (Signed)
Last filled 12/17/10 

## 2011-01-17 NOTE — Telephone Encounter (Signed)
Px written for call in   

## 2011-01-17 NOTE — Telephone Encounter (Signed)
Medication phoned to [facility] [address] [facility] as instructed.  

## 2011-02-19 ENCOUNTER — Ambulatory Visit (INDEPENDENT_AMBULATORY_CARE_PROVIDER_SITE_OTHER): Payer: BC Managed Care – PPO | Admitting: Family Medicine

## 2011-02-19 ENCOUNTER — Encounter: Payer: Self-pay | Admitting: Family Medicine

## 2011-02-19 VITALS — BP 124/72 | HR 76 | Temp 97.7°F | Ht 63.0 in | Wt 121.2 lb

## 2011-02-19 DIAGNOSIS — F411 Generalized anxiety disorder: Secondary | ICD-10-CM

## 2011-02-19 DIAGNOSIS — E0590 Thyrotoxicosis, unspecified without thyrotoxic crisis or storm: Secondary | ICD-10-CM

## 2011-02-19 DIAGNOSIS — E039 Hypothyroidism, unspecified: Secondary | ICD-10-CM | POA: Insufficient documentation

## 2011-02-19 DIAGNOSIS — F17200 Nicotine dependence, unspecified, uncomplicated: Secondary | ICD-10-CM

## 2011-02-19 MED ORDER — PAROXETINE HCL 10 MG PO TABS: 20.0000 mg | ORAL_TABLET | Freq: Every day | ORAL | Status: DC

## 2011-02-19 NOTE — Patient Instructions (Signed)
Let me know if you want to see a counselor again  Start paxil 10 mg - one pill each evening for 2 weeks and then if doing well increase it to 20 mg once daily in evening  If any intolerable side effects or if worse symptoms or depression - stop med and let me know  Follow up with me in 1 month  Checking thyroid labs today  Keep working on smoking Quit coffee  Gradually decrease tea and then switch to decaf Try to eat regular meals and get enough exercise - that is also important for sleep

## 2011-02-19 NOTE — Assessment & Plan Note (Signed)
Followed by endo  Check tsh in light of inc jitteriness and wt loss (may be from anxiety)
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
## 2011-02-19 NOTE — Assessment & Plan Note (Signed)
Disc in detail risks of smoking and possible outcomes including copd, vascular/ heart disease, cancer , respiratory and sinus infections  Pt voices understanding  Pt trying to quit with elect cig (no nicotine)

## 2011-02-20 LAB — TSH: TSH: 1.65 u[IU]/mL (ref 0.35–5.50)

## 2011-03-21 ENCOUNTER — Ambulatory Visit (INDEPENDENT_AMBULATORY_CARE_PROVIDER_SITE_OTHER): Payer: BC Managed Care – PPO | Admitting: Family Medicine

## 2011-03-21 ENCOUNTER — Encounter: Payer: Self-pay | Admitting: Family Medicine

## 2011-03-21 VITALS — BP 110/70 | HR 84 | Temp 97.5°F | Ht 63.0 in | Wt 119.5 lb

## 2011-03-21 DIAGNOSIS — G479 Sleep disorder, unspecified: Secondary | ICD-10-CM | POA: Insufficient documentation

## 2011-03-21 DIAGNOSIS — F419 Anxiety disorder, unspecified: Secondary | ICD-10-CM

## 2011-03-21 DIAGNOSIS — F411 Generalized anxiety disorder: Secondary | ICD-10-CM

## 2011-03-21 DIAGNOSIS — F17200 Nicotine dependence, unspecified, uncomplicated: Secondary | ICD-10-CM

## 2011-03-21 MED ORDER — ALPRAZOLAM 0.25 MG PO TABS: ORAL_TABLET | ORAL | Status: DC

## 2011-03-21 NOTE — Patient Instructions (Signed)
Try the sinenor 6 mg at night for sleep - if it works, just call for a px  If it does not work - stick with the xanax If the generalized anxiety symptoms come back - let me know  Stay away from caffeine

## 2011-03-21 NOTE — Assessment & Plan Note (Signed)
Although paxil did not work for her - overall stressors calmed down and gen anx symptosm (all but sleeplessness) have improved Will keep me update Could consider another ssri if needed Will try silenor or stick with xanax for sleep if needed

## 2011-03-21 NOTE — Progress Notes (Signed)
Subjective:    Patient ID: Regina Ruiz, female    DOB: [birth year], 55 y.o.   MRN: [id number]  HPI Here for f/u of anxiety and stress rxn At last visit disc anxiety symptoms of sleeplessness/ jitteriness- and need for more xanax  Did start her on paxil 10 - with intent to titrate to 20 and is here for f/u   Not taking paxil now  Took 10 for 2 weeks , and then went up to 20 for 2 weeks Did not change her symptoms at all  Feels like her anxiety has become better anyway -- since stress dropped a bit -- trial is prolnged  Did not help at all with sleeping  Still taking xanax - is taking a whole one - almost every night -- takes it if she cannot fall asleep after a while   tsh fine   Does not want to take anything in ssri class  Tried benadryl and does not help- neither did unisom or simply sleep- she wakes up shortly after falling asleep  Is staying away from caffiene - no more whatsoever  Has not tried melatonin or volarian  Still smoking - not ready to quit yet  Patient Active Problem List  Diagnoses  . Anxiety  . UNSPECIFIED VITAMIN D DEFICIENCY  . CIGARETTE SMOKER  . ALLERGIC CONJUNCTIVITIS  . C O P D  . PULMONARY NODULE  . OSTEOPENIA  . Trochanteric bursitis of left hip  . Sciatica of left side  . Colitis  . Post-menopausal bleeding  . Routine general medical examination at a health care facility  . Hypothyroid  . Sleep disorder   Past Medical History  Diagnosis Date  . Anxiety   . COPD (chronic obstructive pulmonary disease)   . Depression   . Thyroid disease   . Osteopenia   . Colitis    Past Surgical History  Procedure Date  . Dilation and curettage of uterus   . Endometrial biopsy 6/12    Dr Audie Box- negative  . Hysteroscopy 6.7.12    [facility]: ENDOMETRIAL POLYP  . Endomet polyp 6/12    removed    History  Substance Use Topics  . Smoking status: Current Everyday Smoker -- 0.5 packs/day  . Smokeless tobacco: Not on file  . Alcohol Use: Yes   Family  History  Problem Relation Age of Onset  . Cancer Father     lung cancer and brain cancer  . Breast cancer Sister   . Stroke Maternal Grandfather   . Stroke Paternal Grandfather    Allergies  Allergen Reactions  . Butalbital-Aspirin-Caffeine     REACTION: unspecified  . Meperidine Hcl     REACTION: unspecified   Current Outpatient Prescriptions on File Prior to Visit  Medication Sig Dispense Refill  . alendronate (FOSAMAX) 70 MG tablet TAKE 1 TABLET BY MOUTH EVERY WEEK  4 tablet  10  . Cholecalciferol (VITAMIN D) 400 UNITS capsule Take 400 Units by mouth daily.        [name] levothyroxine (SYNTHROID, LEVOTHROID) 88 MCG tablet Take 88 mcg by mouth daily.        . benzonatate (TESSALON) 200 MG capsule Take 200 mg by mouth 3 (three) times daily as needed.        . mometasone (ELOCON) 0.1 % cream Apply topically daily as needed.        [name] olopatadine (PATANOL) 0.1 % ophthalmic solution Place 1 drop into both eyes daily as needed.        [name]  peg 3350 powder (MOVIPREP) 100 G SOLR Take 1 kit (100 g total) by mouth once.  1 kit  0         Review of Systems     Objective:   Physical Exam  Constitutional: She appears well-nourished. No distress.  HENT:  Head: Normocephalic and atraumatic.  Mouth/Throat: Oropharynx is clear and moist.  Eyes: Conjunctivae and EOM are normal. Pupils are equal, round, and reactive to light.  Neck: Normal range of motion. Neck supple. No JVD present. Carotid bruit is not present.       No change in thyroid exam  Cardiovascular: Normal rate, regular rhythm, normal heart sounds and intact distal pulses.   Pulmonary/Chest: Effort normal and breath sounds normal. No respiratory distress. She has no wheezes.       Diffusely distant bs   Musculoskeletal: Normal range of motion. She exhibits no edema and no tenderness.  Lymphadenopathy:    She has no cervical adenopathy.  Neurological: She is alert. She has normal reflexes. No cranial nerve deficit. Coordination  normal.  Skin: Skin is warm and dry. No rash noted. No erythema. No pallor.  Psychiatric: She has a normal mood and affect.          Assessment & Plan:

## 2011-03-21 NOTE — Assessment & Plan Note (Signed)
Despite good sleep hygiene and no caffiene Suspect anx related Given samples of silenor 6 mg to try- If not will stay with xanax-which was refilled

## 2011-03-23 NOTE — Assessment & Plan Note (Signed)
Disc in detail risks of smoking and possible outcomes including copd, vascular/ heart disease, cancer , respiratory and sinus infections  Pt voices understanding  

## 2011-04-01 ENCOUNTER — Other Ambulatory Visit: Payer: Self-pay | Admitting: *Deleted

## 2011-04-01 MED ORDER — DOXEPIN HCL 6 MG PO TABS: 1.0000 | ORAL_TABLET | Freq: Every day | ORAL | Status: DC

## 2011-04-01 NOTE — Telephone Encounter (Signed)
I'm glad it worked! Will refill electronically

## 2011-04-01 NOTE — Telephone Encounter (Signed)
Pt was given samples of selenor 6 mg's and she says this worked well.  She is asking for script to be called to [facility].

## 2011-04-01 NOTE — Telephone Encounter (Signed)
Done and in IN box 

## 2011-04-01 NOTE — Telephone Encounter (Signed)
Prior Regina Ruiz is needed for the silenor, form is on your desk.

## 2011-04-02 NOTE — Telephone Encounter (Signed)
Form faxed

## 2011-04-03 NOTE — Telephone Encounter (Signed)
Prior auth given for silinor, pharmacy and patient advised.  Approval letter placed on doctor's desk for signature and scanning.

## 2011-04-29 ENCOUNTER — Other Ambulatory Visit: Payer: Self-pay | Admitting: *Deleted

## 2011-04-29 MED ORDER — ALPRAZOLAM 0.25 MG PO TABS: ORAL_TABLET | ORAL | Status: DC

## 2011-04-29 NOTE — Telephone Encounter (Signed)
Px written for call in   

## 2011-04-29 NOTE — Telephone Encounter (Signed)
Last refill 03/24/2011.

## 2011-04-29 NOTE — Telephone Encounter (Signed)
[facility] 

## 2011-08-29 ENCOUNTER — Telehealth: Payer: Self-pay | Admitting: Family Medicine

## 2011-08-29 MED ORDER — SUMATRIPTAN SUCCINATE 100 MG PO TABS: ORAL_TABLET | ORAL | Status: DC

## 2011-08-29 NOTE — Telephone Encounter (Signed)
Patient notified as instructed by telephone. 

## 2011-08-29 NOTE — Telephone Encounter (Signed)
Triage Record Num: [id number] Operator: Baldomero Lamy Patient Name: Regina Ruiz Call Date & Time: 08/29/2011 9:05:49AM Patient Phone: [phone] PCP: Audrie Gallus. Tower Patient Gender: Female PCP Fax : Patient DOB: Mar 26, 1956 Practice Name: [facility] Reason for Call: Caller: [name]/Patient; PCP: [name]; CB#: [phone]; ; ; Call regarding Pt Requesting Refill On Imitrex; Pt calling requesting a refill on Imitrex. NO Rx found in EPIC. Pt states she had a migraine on 3/14 and has none left. HA better today. Emergent sxs r/o. If Rx can be called in[facility] [address] States should be in chart. Pt call back # above. Protocol(s) Used: Medication Questions - Adult Recommended Outcome per Protocol: Speak with Provider or Pharmacist within 24 hours Reason for Outcome: Requests refill of prescribed medication with valid refills; lack of medications does not put patient at clinical risk

## 2011-08-29 NOTE — Telephone Encounter (Signed)
I spoke with pt and she said it had been many years ago since Dr Milinda Antis had prescribed Imitrex. Pt could not remember last time filled. I could not find on updated or history med list in Publishing copy. Pt request Imitrex. Pt said she had a couple of Imitrex left from last prescription and she took those and still h/a but not as severe. [facility] [address] If med can be sent in.

## 2011-08-29 NOTE — Telephone Encounter (Signed)
Will refill electronically  F/u if headache does not improve (seek care after hours if severe)

## 2011-09-15 ENCOUNTER — Other Ambulatory Visit: Payer: Self-pay | Admitting: *Deleted

## 2011-09-15 MED ORDER — ALPRAZOLAM 0.25 MG PO TABS: ORAL_TABLET | ORAL | Status: DC

## 2011-09-15 NOTE — Telephone Encounter (Signed)
Rx called to [facility] pharmacy.

## 2011-09-15 NOTE — Telephone Encounter (Signed)
Received faxed refill request from pharmacy for Alprazolam. Is it okay to refill medication? 

## 2011-09-15 NOTE — Telephone Encounter (Signed)
Px written for call in   

## 2011-09-25 ENCOUNTER — Other Ambulatory Visit: Payer: Self-pay | Admitting: *Deleted

## 2011-09-25 MED ORDER — DOXEPIN HCL 6 MG PO TABS: 1.0000 | ORAL_TABLET | Freq: Every day | ORAL | Status: DC

## 2011-09-25 NOTE — Telephone Encounter (Signed)
Will refill electronically  

## 2011-10-01 LAB — HM DEXA SCAN

## 2011-10-02 ENCOUNTER — Encounter: Payer: Self-pay | Admitting: Family Medicine

## 2011-10-03 ENCOUNTER — Encounter: Payer: Self-pay | Admitting: Family Medicine

## 2011-10-07 ENCOUNTER — Encounter: Payer: Self-pay | Admitting: Family Medicine

## 2011-10-14 ENCOUNTER — Encounter: Payer: Self-pay | Admitting: Family Medicine

## 2011-11-13 ENCOUNTER — Other Ambulatory Visit: Payer: Self-pay | Admitting: Family Medicine

## 2011-12-07 ENCOUNTER — Telehealth: Payer: Self-pay | Admitting: Family Medicine

## 2011-12-07 DIAGNOSIS — E559 Vitamin D deficiency, unspecified: Secondary | ICD-10-CM

## 2011-12-07 DIAGNOSIS — Z0000 Encounter for general adult medical examination without abnormal findings: Secondary | ICD-10-CM

## 2011-12-07 NOTE — Telephone Encounter (Signed)
Message copied by Judy Pimple on Sun Dec 07, 2011  3:53 PM ------      Message from: Alvina Chou      Created: Wed Dec 03, 2011  4:26 PM      Regarding: labs for Tues 6-25       Patient is scheduled for CPX labs, please order future labs, Thanks , Camelia Eng

## 2011-12-09 ENCOUNTER — Other Ambulatory Visit (INDEPENDENT_AMBULATORY_CARE_PROVIDER_SITE_OTHER): Payer: BC Managed Care – PPO

## 2011-12-09 DIAGNOSIS — Z0000 Encounter for general adult medical examination without abnormal findings: Secondary | ICD-10-CM

## 2011-12-09 DIAGNOSIS — E559 Vitamin D deficiency, unspecified: Secondary | ICD-10-CM

## 2011-12-09 LAB — CBC WITH DIFFERENTIAL/PLATELET
Basophils Relative: 0.6 % (ref 0.0–3.0)
Eosinophils Absolute: 0.1 10*3/uL (ref 0.0–0.7)
Eosinophils Relative: 1.2 % (ref 0.0–5.0)
Hemoglobin: 14.3 g/dL (ref 12.0–15.0)
Lymphocytes Relative: 18.2 % (ref 12.0–46.0)
Monocytes Relative: 6.8 % (ref 3.0–12.0)
Neutrophils Relative %: 73.2 % (ref 43.0–77.0)
RBC: 4.6 Mil/uL (ref 3.87–5.11)
WBC: 9 10*3/uL (ref 4.5–10.5)

## 2011-12-09 LAB — COMPREHENSIVE METABOLIC PANEL
AST: 18 U/L (ref 0–37)
Albumin: 4.1 g/dL (ref 3.5–5.2)
BUN: 13 mg/dL (ref 6–23)
CO2: 30 mEq/L (ref 19–32)
Calcium: 9.1 mg/dL (ref 8.4–10.5)
Chloride: 100 mEq/L (ref 96–112)
GFR: 58.58 mL/min — ABNORMAL LOW (ref >60.00)
Glucose, Bld: 75 mg/dL (ref 70–99)
Potassium: 4.4 mEq/L (ref 3.5–5.1)

## 2011-12-09 LAB — LIPID PANEL
Total CHOL/HDL Ratio: 3
Triglycerides: 117 mg/dL (ref 0.0–149.0)

## 2011-12-16 ENCOUNTER — Ambulatory Visit (INDEPENDENT_AMBULATORY_CARE_PROVIDER_SITE_OTHER): Payer: BC Managed Care – PPO | Admitting: Family Medicine

## 2011-12-16 ENCOUNTER — Other Ambulatory Visit (HOSPITAL_COMMUNITY)
Admission: RE | Admit: 2011-12-16 | Discharge: 2011-12-16 | Disposition: A | Discharge status: Home / Self Care | Payer: BC Managed Care – PPO | Source: Ambulatory Visit | Attending: Family Medicine | Admitting: Family Medicine

## 2011-12-16 ENCOUNTER — Encounter: Payer: Self-pay | Admitting: Family Medicine

## 2011-12-16 VITALS — BP 120/76 | HR 82 | Temp 97.6°F | Ht 60.5 in | Wt 125.0 lb

## 2011-12-16 DIAGNOSIS — E039 Hypothyroidism, unspecified: Secondary | ICD-10-CM

## 2011-12-16 DIAGNOSIS — Z01419 Encounter for gynecological examination (general) (routine) without abnormal findings: Secondary | ICD-10-CM | POA: Insufficient documentation

## 2011-12-16 DIAGNOSIS — M949 Disorder of cartilage, unspecified: Secondary | ICD-10-CM

## 2011-12-16 DIAGNOSIS — M899 Disorder of bone, unspecified: Secondary | ICD-10-CM

## 2011-12-16 DIAGNOSIS — E559 Vitamin D deficiency, unspecified: Secondary | ICD-10-CM

## 2011-12-16 DIAGNOSIS — Z0000 Encounter for general adult medical examination without abnormal findings: Secondary | ICD-10-CM

## 2011-12-16 NOTE — Patient Instructions (Addendum)
Pap smear today Keep working on quitting smoking  Labs are stable

## 2011-12-16 NOTE — Assessment & Plan Note (Signed)
tsh is stable and theraputic No changes needed

## 2011-12-16 NOTE — Assessment & Plan Note (Signed)
Reviewed health habits including diet and exercise and skin cancer prevention Also reviewed health mt list, fam hx and immunizations  Rev wellness labs in detail 

## 2011-12-16 NOTE — Assessment & Plan Note (Signed)
Level ok with current supplementation Disc imp to bone and overall health

## 2011-12-16 NOTE — Progress Notes (Signed)
Subjective:    Patient ID: Regina Ruiz, female    DOB: [birth year], 56 y.o.   MRN: [id number]  HPI Here for health maintenance exam and to review chronic medical problems   Has been feeling well   Officially divorced - and will be able to change name soon   Has to fill out physical form for work   bp is 120/76 Wt is up 6 lb with bmi of 24  Osteopenia- vit D is 53 Fosamax- 2-3 years , is tolerating it  Ca and D dexa recent -no change   Still smoking - plans to think about quitting soon  She is trying the electric cigarette now - can go longer in between    Pap 4/11 - re check planned for October- calcifications  Hx of endometrial polyp removed last year  No problems at all since Needs her pap - no hx of abn paps   mammo 4/13 Self exam --no lumps   colonosc 7/12- 10 year recall  Up to date   Cholesterol Lab Results  Component Value Date   CHOL 169 12/09/2011   CHOL 128 11/04/2010   CHOL 149 09/21/2009   Lab Results  Component Value Date   HDL 54.40 12/09/2011   HDL 40.98 11/04/2010   HDL 11.91 09/21/2009   Lab Results  Component Value Date   LDLCALC 91 12/09/2011   LDLCALC 63 11/04/2010   LDLCALC 85 09/21/2009   Lab Results  Component Value Date   TRIG 117.0 12/09/2011   TRIG 82.0 11/04/2010   TRIG 92.0 09/21/2009   Lab Results  Component Value Date   CHOLHDL 3 12/09/2011   CHOLHDL 3 11/04/2010   CHOLHDL 3 09/21/2009   No results found for this basename: LDLDIRECT   is at goal overall - better HDL  Overall eats pretty healthy - cheats occas   Patient Active Problem List  Diagnosis  . Anxiety  . UNSPECIFIED VITAMIN D DEFICIENCY  . CIGARETTE SMOKER  . ALLERGIC CONJUNCTIVITIS  . C O P D  . PULMONARY NODULE  . OSTEOPENIA  . Trochanteric bursitis of left hip  . Sciatica of left side  . Colitis  . Post-menopausal bleeding  . Routine general medical examination at a health care facility  . Hypothyroid  . Sleep disorder  . Routine gynecological examination    Past Medical History  Diagnosis Date  . Anxiety   . COPD (chronic obstructive pulmonary disease)   . Depression   . Thyroid disease   . Osteopenia   . Colitis    Past Surgical History  Procedure Date  . Dilation and curettage of uterus   . Endometrial biopsy 6/12    Dr Audie Box- negative  . Hysteroscopy 6.7.12    [facility]: ENDOMETRIAL POLYP  . Endomet polyp 6/12    removed    History  Substance Use Topics  . Smoking status: Current Everyday Smoker -- 0.5 packs/day  . Smokeless tobacco: Not on file  . Alcohol Use: Yes   Family History  Problem Relation Age of Onset  . Cancer Father     lung cancer and brain cancer  . Breast cancer Sister   . Stroke Maternal Grandfather   . Stroke Paternal Grandfather    Allergies  Allergen Reactions  . Butalbital-Aspirin-Caffeine     REACTION: unspecified  . Meperidine Hcl     REACTION: unspecified  . Zolpidem Tartrate    Current Outpatient Prescriptions on File Prior to Visit  Medication Sig Dispense Refill  .  alendronate (FOSAMAX) 70 MG tablet TAKE 1 TABLET BY MOUTH EVERY WEEK  4 tablet  5  . ALPRAZolam (XANAX) 0.25 MG tablet Take 1 tablet at bedtime as needed for sleep  30 tablet  5  . benzonatate (TESSALON) 200 MG capsule Take 200 mg by mouth 3 (three) times daily as needed.        . Cholecalciferol (VITAMIN D) 400 UNITS capsule Take 400 Units by mouth daily.        . Doxepin HCl (SILENOR) 6 MG TABS Take 1 tablet (6 mg total) by mouth at bedtime.  30 tablet  5  . levothyroxine (SYNTHROID, LEVOTHROID) 88 MCG tablet Take 88 mcg by mouth daily.        . SUMAtriptan (IMITREX) 100 MG tablet Take 1 pill orally for migraine - if necessary may repeat once in 2 hours  10 tablet  3  . mometasone (ELOCON) 0.1 % cream Apply topically daily as needed.        [name] olopatadine (PATANOL) 0.1 % ophthalmic solution Place 1 drop into both eyes daily as needed.        . peg 3350 powder (MOVIPREP) 100 G SOLR Take 1 kit (100 g total) by mouth once.  1  kit  0      Review of Systems Review of Systems  Constitutional: Negative for fever, appetite change, fatigue and unexpected weight change.  Eyes: Negative for pain and visual disturbance.  Respiratory: Negative for cough and shortness of breath.   Cardiovascular: Negative for cp or palpitations    Gastrointestinal: Negative for nausea, diarrhea and constipation.  Genitourinary: Negative for urgency and frequency.  Skin: Negative for pallor or rash   Neurological: Negative for weakness, light-headedness, numbness and headaches.  Hematological: Negative for adenopathy. Does not bruise/bleed easily.  Psychiatric/Behavioral: Negative for dysphoric mood. The patient is not nervous/anxious.         Objective:   Physical Exam  Constitutional: She appears well-developed and well-nourished. No distress.  HENT:  Head: Normocephalic and atraumatic.  Right Ear: External ear normal.  Left Ear: External ear normal.  Nose: Nose normal.  Mouth/Throat: Oropharynx is clear and moist.  Eyes: Conjunctivae and EOM are normal. Pupils are equal, round, and reactive to light. No scleral icterus.  Neck: Normal range of motion. Neck supple. No JVD present. Carotid bruit is not present. No thyromegaly present.  Cardiovascular: Normal rate, regular rhythm, normal heart sounds and intact distal pulses.  Exam reveals no gallop.   Pulmonary/Chest: Effort normal and breath sounds normal. No respiratory distress. She has no wheezes. She exhibits no tenderness.       Diffusely distant bs   Abdominal: Soft. Bowel sounds are normal. She exhibits no distension, no abdominal bruit and no mass. There is no tenderness.  Genitourinary: Vagina normal and uterus normal. No breast swelling, tenderness, discharge or bleeding. There is no lesion on the right labia. There is no lesion on the left labia. Uterus is not enlarged and not tender. Cervix exhibits no motion tenderness, no discharge and no friability. Right adnexum  displays no mass, no tenderness and no fullness. Left adnexum displays no mass, no tenderness and no fullness. No bleeding around the vagina. No vaginal discharge found.       Breast exam: No mass, nodules, thickening, tenderness, bulging, retraction, inflamation, nipple discharge or skin changes noted.  No axillary or clavicular LA.  Chaperoned exam.    Musculoskeletal: She exhibits no edema and no tenderness.  Lymphadenopathy:  She has no cervical adenopathy.  Neurological: She is alert. She has normal reflexes. No cranial nerve deficit. She exhibits normal muscle tone. Coordination normal.  Skin: Skin is warm and dry. No rash noted. No erythema. No pallor.  Psychiatric: She has a normal mood and affect.          Assessment & Plan:

## 2011-12-16 NOTE — Assessment & Plan Note (Signed)
This is stable in recent dexa Disc imp of smoking cessation Will complete 5 years of fosamax if well tolerated  Vit D level ok

## 2011-12-16 NOTE — Assessment & Plan Note (Signed)
Annual exam with pap  No evid of cervical polyp and no more bleeding

## 2012-02-06 ENCOUNTER — Ambulatory Visit (INDEPENDENT_AMBULATORY_CARE_PROVIDER_SITE_OTHER): Payer: BC Managed Care – PPO | Admitting: Family Medicine

## 2012-02-06 ENCOUNTER — Encounter: Payer: Self-pay | Admitting: Family Medicine

## 2012-02-06 VITALS — BP 110/62 | HR 70 | Temp 97.7°F | Ht 60.5 in | Wt 130.2 lb

## 2012-02-06 DIAGNOSIS — IMO0002 Reserved for concepts with insufficient information to code with codable children: Secondary | ICD-10-CM | POA: Insufficient documentation

## 2012-02-06 MED ORDER — CYCLOBENZAPRINE HCL 10 MG PO TABS: 10.0000 mg | ORAL_TABLET | Freq: Every evening | ORAL | Status: AC | PRN

## 2012-02-06 MED ORDER — MELOXICAM 15 MG PO TABS: 15.0000 mg | ORAL_TABLET | Freq: Every day | ORAL | Status: DC

## 2012-02-06 NOTE — Assessment & Plan Note (Signed)
R hamstring/ hip adductor area  Suspect hurt it doing band exercises  Adv heat and stretching  mobic daily 1-2 wk, flexeril at night  Update if not starting to improve in a week or if worsening

## 2012-02-06 NOTE — Progress Notes (Signed)
Subjective:    Patient ID: Regina Ruiz, female    DOB: Jul 28, 1955, 56 y.o.   MRN: [id number]  HPI R leg is bothering her  Hurts in upper thigh - starting to shoot down and up to buttock also  Feels a little swollen also  Has been about 2 weeks  ? If pulled a muscle  Works out [employer]- does stretch- but does not remember a particular traumatic moment   She does do stretches with a band - may have hurt it then   Is a little tingly in leg and foot No weakness   Patient Active Problem List  Diagnosis  . Anxiety  . UNSPECIFIED VITAMIN D DEFICIENCY  . CIGARETTE SMOKER  . ALLERGIC CONJUNCTIVITIS  . C O P D  . PULMONARY NODULE  . OSTEOPENIA  . Trochanteric bursitis of left hip  . Sciatica of left side  . Colitis  . Post-menopausal bleeding  . Routine general medical examination at a health care facility  . Hypothyroid  . Sleep disorder  . Routine gynecological examination   Past Medical History  Diagnosis Date  . Anxiety   . COPD (chronic obstructive pulmonary disease)   . Depression   . Thyroid disease   . Osteopenia   . Colitis    Past Surgical History  Procedure Date  . Dilation and curettage of uterus   . Endometrial biopsy 6/12    Dr Audie Box- negative  . Hysteroscopy 6.7.12    [facility]: ENDOMETRIAL POLYP  . Endomet polyp 6/12    removed    History  Substance Use Topics  . Smoking status: Current Everyday Smoker -- 0.5 packs/day  . Smokeless tobacco: Not on file  . Alcohol Use: Yes   Family History  Problem Relation Age of Onset  . Cancer Father     lung cancer and brain cancer  . Breast cancer Sister   . Stroke Maternal Grandfather   . Stroke Paternal Grandfather    Allergies  Allergen Reactions  . Butalbital-Aspirin-Caffeine     REACTION: unspecified  . Meperidine Hcl     REACTION: unspecified  . Zolpidem Tartrate    Current Outpatient Prescriptions on File Prior to Visit  Medication Sig Dispense Refill  . alendronate (FOSAMAX) 70 MG tablet  TAKE 1 TABLET BY MOUTH EVERY WEEK  4 tablet  5  . ALPRAZolam (XANAX) 0.25 MG tablet Take 1 tablet at bedtime as needed for sleep  30 tablet  5  . Cholecalciferol (VITAMIN D) 400 UNITS capsule Take 400 Units by mouth daily.        . Doxepin HCl (SILENOR) 6 MG TABS Take 1 tablet (6 mg total) by mouth at bedtime.  30 tablet  5  . levothyroxine (SYNTHROID, LEVOTHROID) 88 MCG tablet Take 88 mcg by mouth daily.        . SUMAtriptan (IMITREX) 100 MG tablet Take 1 pill orally for migraine - if necessary may repeat once in 2 hours  10 tablet  3  . benzonatate (TESSALON) 200 MG capsule Take 200 mg by mouth 3 (three) times daily as needed.        . mometasone (ELOCON) 0.1 % cream Apply topically daily as needed.        [name] olopatadine (PATANOL) 0.1 % ophthalmic solution Place 1 drop into both eyes daily as needed.        . peg 3350 powder (MOVIPREP) 100 G SOLR Take 1 kit (100 g total) by mouth once.  1 kit  0  Review of Systems    Review of Systems  Constitutional: Negative for fever, appetite change, fatigue and unexpected weight change.  Eyes: Negative for pain and visual disturbance.  Respiratory: Negative for cough and shortness of breath.   Cardiovascular: Negative for cp or palpitations    Gastrointestinal: Negative for nausea, diarrhea and constipation.  Genitourinary: Negative for urgency and frequency.  Skin: Negative for pallor or rash  no redness MSK pos for leg pain, neg for joint swelling or warmth Neurological: Negative for weakness, light-headedness, numbness and headaches.  Hematological: Negative for adenopathy. Does not bruise/bleed easily.  Psychiatric/Behavioral: Negative for dysphoric mood. The patient is not nervous/anxious.      Objective:   Physical Exam  Constitutional: She appears well-developed and well-nourished.  HENT:  Head: Normocephalic and atraumatic.  Eyes: Conjunctivae and EOM are normal. Pupils are equal, round, and reactive to light.  Neck: Normal  range of motion. Neck supple.  Cardiovascular: Normal rate and regular rhythm.   Pulmonary/Chest: Effort normal and breath sounds normal. She has no rales.       Diffusely distant bs   Abdominal: Soft. Bowel sounds are normal.       No suprapubic tenderness or fullness    Musculoskeletal: Normal range of motion. She exhibits tenderness. She exhibits no edema.       R leg- tender in hamstring and medial thigh No palp cords/swelling/ warmth or focal areas of pain Nl rom leg- hurts to int rotate and abduct hip  No LS tenderness No buttock tenderness   Neurological: She is alert. She has normal strength and normal reflexes. She displays no atrophy and normal reflexes. No sensory deficit. She exhibits normal muscle tone.  Skin: Skin is warm and dry. No rash noted. No erythema. No pallor.  Psychiatric: She has a normal mood and affect.          Assessment & Plan:

## 2012-02-06 NOTE — Patient Instructions (Addendum)
Use heat on sore area of leg Gentle stretching only  Walking is fine  Try flexeril (muscle relaxer) at night  meloxicam with food daily for 1-2 wk If worse or not imp in 2 weeks - call

## 2012-03-29 ENCOUNTER — Other Ambulatory Visit: Payer: Self-pay | Admitting: Family Medicine

## 2012-03-30 NOTE — Telephone Encounter (Signed)
Px written for call in   

## 2012-03-30 NOTE — Telephone Encounter (Signed)
Rx called in as prescribed 

## 2012-03-30 NOTE — Telephone Encounter (Signed)
Ok to refill 

## 2012-04-09 ENCOUNTER — Encounter: Payer: Self-pay | Admitting: Family Medicine

## 2012-04-12 ENCOUNTER — Ambulatory Visit (INDEPENDENT_AMBULATORY_CARE_PROVIDER_SITE_OTHER): Payer: BC Managed Care – PPO | Admitting: Family Medicine

## 2012-04-12 ENCOUNTER — Ambulatory Visit (INDEPENDENT_AMBULATORY_CARE_PROVIDER_SITE_OTHER)
Admission: RE | Admit: 2012-04-12 | Discharge: 2012-04-12 | Disposition: A | Discharge status: Home / Self Care | Payer: BC Managed Care – PPO | Source: Ambulatory Visit | Attending: Family Medicine | Admitting: Family Medicine

## 2012-04-12 ENCOUNTER — Encounter: Payer: Self-pay | Admitting: Family Medicine

## 2012-04-12 VITALS — BP 112/64 | HR 74 | Temp 97.8°F | Ht 60.5 in | Wt 129.8 lb

## 2012-04-12 DIAGNOSIS — M545 Low back pain, unspecified: Secondary | ICD-10-CM

## 2012-04-12 DIAGNOSIS — M25559 Pain in unspecified hip: Secondary | ICD-10-CM

## 2012-04-12 DIAGNOSIS — M25551 Pain in right hip: Secondary | ICD-10-CM

## 2012-04-12 DIAGNOSIS — M79604 Pain in right leg: Secondary | ICD-10-CM | POA: Insufficient documentation

## 2012-04-12 MED ORDER — MELOXICAM 15 MG PO TABS: 15.0000 mg | ORAL_TABLET | Freq: Every day | ORAL | Status: DC

## 2012-04-12 NOTE — Progress Notes (Signed)
Subjective:    Patient ID: Regina Ruiz, female    DOB: 1955/10/20, 56 y.o.   MRN: [id number]  HPI Here for R leg pain again   Got better  Then started again   Now is tingling on the side of her leg  Tried tylenol arthritis- no improvement  Also the knee hurts - and feels unstable at all She put heat on it  Wakes her up at night   No swelling in leg or knee   Some pain in lower buttock   No redness  No weakness at all   Last time took mobic - and that helped- none left  Son had Perthie's dz   Patient Active Problem List  Diagnosis  . Anxiety  . UNSPECIFIED VITAMIN D DEFICIENCY  . CIGARETTE SMOKER  . ALLERGIC CONJUNCTIVITIS  . C O P D  . PULMONARY NODULE  . OSTEOPENIA  . Trochanteric bursitis of left hip  . Sciatica of left side  . Colitis  . Post-menopausal bleeding  . Routine general medical examination at a health care facility  . Hypothyroid  . Sleep disorder  . Routine gynecological examination  . Leg strain   Past Medical History  Diagnosis Date  . Anxiety   . COPD (chronic obstructive pulmonary disease)   . Depression   . Thyroid disease   . Osteopenia   . Colitis    Past Surgical History  Procedure Date  . Dilation and curettage of uterus   . Endometrial biopsy 6/12    Dr Audie Box- negative  . Hysteroscopy 6.7.12    [facility]: ENDOMETRIAL POLYP  . Endomet polyp 6/12    removed    History  Substance Use Topics  . Smoking status: Current Every Day Smoker -- 0.5 packs/day  . Smokeless tobacco: Not on file  . Alcohol Use: Yes     rare   Family History  Problem Relation Age of Onset  . Cancer Father     lung cancer and brain cancer  . Breast cancer Sister   . Stroke Maternal Grandfather   . Stroke Paternal Grandfather    Allergies  Allergen Reactions  . Butalbital-Aspirin-Caffeine     REACTION: unspecified  . Meperidine Hcl     REACTION: unspecified  . Zolpidem Tartrate    Current Outpatient Prescriptions on File Prior to Visit    Medication Sig Dispense Refill  . alendronate (FOSAMAX) 70 MG tablet TAKE 1 TABLET BY MOUTH EVERY WEEK  4 tablet  5  . ALPRAZolam (XANAX) 0.25 MG tablet TAKE 1 TABLET AT BEDTIME  30 tablet  5  . benzonatate (TESSALON) 200 MG capsule Take 200 mg by mouth 3 (three) times daily as needed.        . Cholecalciferol (VITAMIN D) 400 UNITS capsule Take 400 Units by mouth daily.        [name] levothyroxine (SYNTHROID, LEVOTHROID) 88 MCG tablet Take 88 mcg by mouth daily.        . SUMAtriptan (IMITREX) 100 MG tablet Take 1 pill orally for migraine - if necessary may repeat once in 2 hours  10 tablet  3  . DISCONTD: Doxepin HCl (SILENOR) 6 MG TABS Take 1 tablet (6 mg total) by mouth at bedtime.  30 tablet  5  . meloxicam (MOBIC) 15 MG tablet Take 1 tablet (15 mg total) by mouth daily. With food  30 tablet  0       Review of Systems    Review of Systems  Constitutional: Negative  for fever, appetite change, fatigue and unexpected weight change.  Eyes: Negative for pain and visual disturbance.  Respiratory: Negative for cough and shortness of breath.   Cardiovascular: Negative for cp or palpitations    Gastrointestinal: Negative for nausea, diarrhea and constipation.  Genitourinary: Negative for urgency and frequency.  Skin: Negative for pallor or rash   MSK pos for leg/ back/ hip pain on R Neurological: Negative for weakness, light-headedness, numbness and headaches. pos for tingling of R leg Hematological: Negative for adenopathy. Does not bruise/bleed easily.  Psychiatric/Behavioral: Negative for dysphoric mood. The patient is not nervous/anxious.      Objective:   Physical Exam  Constitutional: She appears well-developed and well-nourished. No distress.  HENT:  Head: Normocephalic and atraumatic.  Eyes: Conjunctivae normal and EOM are normal. Pupils are equal, round, and reactive to light.  Neck: Normal range of motion. Neck supple.  Cardiovascular: Normal rate and regular rhythm.    Pulmonary/Chest: Effort normal and breath sounds normal.  Musculoskeletal: She exhibits tenderness. She exhibits no edema.       Right hip: She exhibits decreased range of motion and tenderness. She exhibits normal strength, no bony tenderness, no swelling and no crepitus.       Right knee: She exhibits normal range of motion, no swelling, no effusion, no ecchymosis, no deformity, no erythema, normal patellar mobility and no bony tenderness.       Lumbar back: She exhibits decreased range of motion, tenderness and spasm. She exhibits no bony tenderness, no swelling and no deformity.       LS- pain on full extension and also L lat bend Tender in R piriformis area with spasm   Pain on internal hip rotation (pain in groin area) Nl flexation Very mild troch tenderness on R  Lymphadenopathy:    She has no cervical adenopathy.  Neurological: She is alert. She has normal reflexes. She displays no atrophy. No cranial nerve deficit or sensory deficit. She exhibits normal muscle tone. Coordination and gait normal.  Skin: Skin is warm and dry. No rash noted. No erythema.  Psychiatric: She has a normal mood and affect.          Assessment & Plan:

## 2012-04-12 NOTE — Assessment & Plan Note (Signed)
Will get her back on meloxicam  Heat prn Xray today

## 2012-04-12 NOTE — Patient Instructions (Addendum)
Xray of lumbar spine and hip today  We will call with result tomorrow- then will make a plan Use heat if it helps  Take the mobic (meloxicam) with food as needed

## 2012-04-12 NOTE — Assessment & Plan Note (Signed)
Some groin pain on int rot Also some troch tenderness (very mild) mobic daily Xray today hip

## 2012-04-13 ENCOUNTER — Telehealth: Payer: Self-pay | Admitting: Family Medicine

## 2012-04-13 DIAGNOSIS — M545 Low back pain, unspecified: Secondary | ICD-10-CM

## 2012-04-13 NOTE — Telephone Encounter (Signed)
Message copied by Judy Pimple on Tue Apr 13, 2012  1:06 PM ------      Message from: Shon Millet      Created: Tue Apr 13, 2012 11:32 AM       Notified pt of xray results, pt is ok with referral to ortho

## 2012-04-13 NOTE — Telephone Encounter (Signed)
Will ref to ortho

## 2012-04-29 ENCOUNTER — Other Ambulatory Visit: Payer: Self-pay | Admitting: Family Medicine

## 2012-09-17 ENCOUNTER — Other Ambulatory Visit: Payer: Self-pay | Admitting: Family Medicine

## 2012-09-17 NOTE — Telephone Encounter (Signed)
rx called in as prescribed

## 2012-09-17 NOTE — Telephone Encounter (Signed)
Px written for call in   

## 2012-09-17 NOTE — Telephone Encounter (Signed)
Ok to refill 

## 2012-10-04 ENCOUNTER — Encounter: Payer: Self-pay | Admitting: Family Medicine

## 2012-10-04 ENCOUNTER — Ambulatory Visit (INDEPENDENT_AMBULATORY_CARE_PROVIDER_SITE_OTHER): Payer: BC Managed Care – PPO | Admitting: Family Medicine

## 2012-10-04 VITALS — BP 126/72 | HR 74 | Temp 98.5°F | Ht 60.5 in | Wt 133.0 lb

## 2012-10-04 DIAGNOSIS — K2970 Gastritis, unspecified, without bleeding: Secondary | ICD-10-CM

## 2012-10-04 DIAGNOSIS — K2990 Gastroduodenitis, unspecified, without bleeding: Secondary | ICD-10-CM | POA: Insufficient documentation

## 2012-10-04 DIAGNOSIS — R109 Unspecified abdominal pain: Secondary | ICD-10-CM

## 2012-10-04 LAB — POCT URINALYSIS DIPSTICK
Ketones, UA: NEGATIVE
Leukocytes, UA: NEGATIVE
Protein, UA: NEGATIVE
Urobilinogen, UA: 0.2

## 2012-10-04 MED ORDER — RANITIDINE HCL 150 MG PO CAPS: 150.0000 mg | ORAL_CAPSULE | Freq: Two times a day (BID) | ORAL | Status: DC

## 2012-10-04 NOTE — Patient Instructions (Addendum)
Hold fosamax for now  Take zantac 150 mg twice daily (I sent generic to [facility])- for 1 month- (call if not helping) Then after 1 month if you feel ok, go ahead and stop it (and keep me posted)  Avoid spicy and acidic foods and beverages and caffeine Drink lots of water

## 2012-10-04 NOTE — Progress Notes (Signed)
Subjective:    Patient ID: Regina Ruiz, female    DOB: April 24, 1956, 57 y.o.   MRN: [id number]  HPI Here for abdominal pain  Going on for 2 weeks and getting worse  Pressure / bloating/ pain - and feels like she gained wt  Tried gas x -helped a bit  Pains start in epigastric area -mostly upper abdomen  Sort of burning in nature occ takes ibuprofen-not often  Watching what she eats - this is worse if she overeats  No particular foods set it off  No n/v Constipated 4-5 days  Patient Active Problem List  Diagnosis  . Anxiety  . UNSPECIFIED VITAMIN D DEFICIENCY  . CIGARETTE SMOKER  . ALLERGIC CONJUNCTIVITIS  . C O P D  . PULMONARY NODULE  . OSTEOPENIA  . Trochanteric bursitis of left hip  . Sciatica of left side  . Colitis  . Post-menopausal bleeding  . Routine general medical examination at a health care facility  . Hypothyroid  . Sleep disorder  . Routine gynecological examination  . Leg strain  . Lumbar pain with radiation down right leg  . Right hip pain   Past Medical History  Diagnosis Date  . Anxiety   . COPD (chronic obstructive pulmonary disease)   . Depression   . Thyroid disease   . Osteopenia   . Colitis    Past Surgical History  Procedure Laterality Date  . Dilation and curettage of uterus    . Endometrial biopsy  6/12    Dr Audie Box- negative  . Hysteroscopy  6.7.12    [facility]: ENDOMETRIAL POLYP  . Endomet polyp  6/12    removed    History  Substance Use Topics  . Smoking status: Current Every Day Smoker -- 0.50 packs/day  . Smokeless tobacco: Not on file  . Alcohol Use: Yes     Comment: rare   Family History  Problem Relation Age of Onset  . Cancer Father     lung cancer and brain cancer  . Breast cancer Sister   . Stroke Maternal Grandfather   . Stroke Paternal Grandfather    Allergies  Allergen Reactions  . Butalbital-Aspirin-Caffeine     REACTION: unspecified  . Meperidine Hcl     REACTION: unspecified  . Zolpidem Tartrate     Current Outpatient Prescriptions on File Prior to Visit  Medication Sig Dispense Refill  . alendronate (FOSAMAX) 70 MG tablet TAKE 1 TABLET BY MOUTH EVERY WEEK  4 tablet  5  . ALPRAZolam (XANAX) 0.25 MG tablet TAKE 1 TABLET BY MOUTH AT BEDTIME  30 tablet  5  . benzonatate (TESSALON) 200 MG capsule Take 200 mg by mouth 3 (three) times daily as needed.       . Cholecalciferol (VITAMIN D) 400 UNITS capsule Take 400 Units by mouth daily.        [name] levothyroxine (SYNTHROID, LEVOTHROID) 88 MCG tablet Take 88 mcg by mouth daily.        . meloxicam (MOBIC) 15 MG tablet Take 1 tablet (15 mg total) by mouth daily. With food  30 tablet  3  . SUMAtriptan (IMITREX) 100 MG tablet Take 1 pill orally for migraine - if necessary may repeat once in 2 hours  10 tablet  3   No current facility-administered medications on file prior to visit.      Review of Systems Review of Systems  Constitutional: Negative for fever, appetite change, fatigue and unexpected weight change.  Eyes: Negative for pain and visual  disturbance.  Respiratory: Negative for cough and shortness of breath.   Cardiovascular: Negative for cp or palpitations    Gastrointestinal: Negative for nausea, diarrhea and constipation. pos for burning abd pain and some heartburn Genitourinary: Negative for urgency and frequency.  Skin: Negative for pallor or rash   Neurological: Negative for weakness, light-headedness, numbness and headaches.  Hematological: Negative for adenopathy. Does not bruise/bleed easily.  Psychiatric/Behavioral: Negative for dysphoric mood. The patient is not nervous/anxious.         Objective:   Physical Exam  Constitutional: She appears well-developed and well-nourished. No distress.  HENT:  Head: Normocephalic and atraumatic.  Eyes: Conjunctivae and EOM are normal. Pupils are equal, round, and reactive to light. Right eye exhibits no discharge. Left eye exhibits no discharge. No scleral icterus.  Neck: Normal  range of motion. Neck supple. No thyromegaly present.  Cardiovascular: Normal rate and regular rhythm.   Pulmonary/Chest: Effort normal and breath sounds normal.  Abdominal: Soft. Bowel sounds are normal. She exhibits no distension and no mass. There is no hepatosplenomegaly. There is tenderness in the epigastric area and left upper quadrant. There is no rebound, no guarding, no tenderness at McBurney's point and negative Murphy's sign.  Lymphadenopathy:    She has no cervical adenopathy.  Neurological: She is alert.  Skin: Skin is warm and dry. No erythema. No pallor.  Psychiatric: She has a normal mood and affect.          Assessment & Plan:

## 2012-10-11 ENCOUNTER — Other Ambulatory Visit: Payer: Self-pay | Admitting: *Deleted

## 2012-10-11 MED ORDER — ALENDRONATE SODIUM 70 MG PO TABS: 70.0000 mg | ORAL_TABLET | ORAL | Status: DC

## 2012-10-15 ENCOUNTER — Encounter: Payer: Self-pay | Admitting: Family Medicine

## 2012-12-02 ENCOUNTER — Telehealth: Payer: Self-pay | Admitting: Family Medicine

## 2012-12-02 DIAGNOSIS — M899 Disorder of bone, unspecified: Secondary | ICD-10-CM

## 2012-12-02 DIAGNOSIS — Z0000 Encounter for general adult medical examination without abnormal findings: Secondary | ICD-10-CM

## 2012-12-02 DIAGNOSIS — E559 Vitamin D deficiency, unspecified: Secondary | ICD-10-CM

## 2012-12-02 NOTE — Telephone Encounter (Signed)
Message copied by Judy Pimple on Thu Dec 02, 2012  3:59 PM ------      Message from: Baldomero Lamy      Created: Tue Nov 30, 2012  1:05 PM      Regarding: Cpx labs Mon 6/30       Please order  future cpx labs for pt's upcoming lab appt.      Thanks      Tasha       ------

## 2012-12-10 ENCOUNTER — Encounter: Payer: Self-pay | Admitting: Radiology

## 2012-12-13 ENCOUNTER — Other Ambulatory Visit (INDEPENDENT_AMBULATORY_CARE_PROVIDER_SITE_OTHER): Payer: BC Managed Care – PPO

## 2012-12-13 DIAGNOSIS — Z01419 Encounter for gynecological examination (general) (routine) without abnormal findings: Secondary | ICD-10-CM

## 2012-12-13 DIAGNOSIS — Z0000 Encounter for general adult medical examination without abnormal findings: Secondary | ICD-10-CM

## 2012-12-13 DIAGNOSIS — E039 Hypothyroidism, unspecified: Secondary | ICD-10-CM

## 2012-12-13 DIAGNOSIS — M899 Disorder of bone, unspecified: Secondary | ICD-10-CM

## 2012-12-13 DIAGNOSIS — E559 Vitamin D deficiency, unspecified: Secondary | ICD-10-CM

## 2012-12-13 LAB — CBC WITH DIFFERENTIAL/PLATELET
Basophils Absolute: 0.1 10*3/uL (ref 0.0–0.1)
Basophils Relative: 0.7 % (ref 0.0–3.0)
Eosinophils Absolute: 0.1 10*3/uL (ref 0.0–0.7)
HCT: 40.3 % (ref 36.0–46.0)
Hemoglobin: 13.8 g/dL (ref 12.0–15.0)
Lymphs Abs: 1.8 10*3/uL (ref 0.7–4.0)
MCHC: 34.3 g/dL (ref 30.0–36.0)
Monocytes Relative: 7 % (ref 3.0–12.0)
Neutro Abs: 5.7 10*3/uL (ref 1.4–7.7)
RBC: 4.36 Mil/uL (ref 3.87–5.11)
RDW: 12.4 % (ref 11.5–14.6)

## 2012-12-13 LAB — COMPREHENSIVE METABOLIC PANEL
ALT: 14 U/L (ref 0–35)
BUN: 12 mg/dL (ref 6–23)
CO2: 29 mEq/L (ref 19–32)
Calcium: 8.7 mg/dL (ref 8.4–10.5)
Creatinine, Ser: 1 mg/dL (ref 0.4–1.2)
GFR: 61.78 mL/min (ref >60.00)
Total Bilirubin: 0.4 mg/dL (ref 0.3–1.2)

## 2012-12-13 LAB — TSH: TSH: 3.28 u[IU]/mL (ref 0.35–5.50)

## 2012-12-13 LAB — LIPID PANEL
Cholesterol: 151 mg/dL (ref 0–200)
HDL: 52 mg/dL (ref >39.00)
Triglycerides: 75 mg/dL (ref 0.0–149.0)
VLDL: 15 mg/dL (ref 0.0–40.0)

## 2012-12-20 ENCOUNTER — Encounter: Payer: Self-pay | Admitting: Family Medicine

## 2012-12-20 ENCOUNTER — Ambulatory Visit (INDEPENDENT_AMBULATORY_CARE_PROVIDER_SITE_OTHER): Payer: BC Managed Care – PPO | Admitting: Family Medicine

## 2012-12-20 VITALS — BP 122/76 | HR 75 | Temp 98.1°F | Ht 63.0 in | Wt 134.2 lb

## 2012-12-20 DIAGNOSIS — Z0000 Encounter for general adult medical examination without abnormal findings: Secondary | ICD-10-CM

## 2012-12-20 DIAGNOSIS — M899 Disorder of bone, unspecified: Secondary | ICD-10-CM

## 2012-12-20 DIAGNOSIS — E039 Hypothyroidism, unspecified: Secondary | ICD-10-CM

## 2012-12-20 DIAGNOSIS — F17200 Nicotine dependence, unspecified, uncomplicated: Secondary | ICD-10-CM

## 2012-12-20 DIAGNOSIS — F419 Anxiety disorder, unspecified: Secondary | ICD-10-CM

## 2012-12-20 DIAGNOSIS — M949 Disorder of cartilage, unspecified: Secondary | ICD-10-CM

## 2012-12-20 DIAGNOSIS — E559 Vitamin D deficiency, unspecified: Secondary | ICD-10-CM

## 2012-12-20 DIAGNOSIS — F411 Generalized anxiety disorder: Secondary | ICD-10-CM

## 2012-12-20 MED ORDER — VARENICLINE TARTRATE 1 MG PO TABS: 1.0000 mg | ORAL_TABLET | Freq: Two times a day (BID) | ORAL | Status: DC

## 2012-12-20 MED ORDER — SUMATRIPTAN SUCCINATE 100 MG PO TABS: ORAL_TABLET | ORAL | Status: DC

## 2012-12-20 MED ORDER — VARENICLINE TARTRATE 0.5 MG X 11 & 1 MG X 42 PO MISC: ORAL | Status: DC

## 2012-12-20 NOTE — Assessment & Plan Note (Signed)
Disc in detail risks of smoking and possible outcomes including copd, vascular/ heart disease, cancer , respiratory and sinus infections  Pt voices understanding Pt is interested in quitting soon - given px for chantix and disc poss side eff (she has done well with this in the past)

## 2012-12-20 NOTE — Progress Notes (Signed)
Subjective:    Patient ID: Regina Ruiz, female    DOB: 22-Jun-1955, 57 y.o.   MRN: [id number]  HPI Here for health maintenance exam and to review chronic medical problems    Wt is up 1 lb with bmi 23  Flu shot 10/13 Mammogram 5/14 -nl  No lumps on self exam  Pap 7/13 - here/ normal , no bleeding  No gyn problems at all   Td 12/07  colonosc 7/12 -- 10 year recall   dexa 4/13  D level is normal at 59  She did have to stop her fosamax (GI upset and pain)  Anxiety -was doing well until she had an episode the first of June  Had a panic attack and took a xanax  Mood is generally good - stays motivated and happy and upbeat  Takes a xanax about once or twice per week at most   More headaches lately - ? Hormonal  Some caffeine - 2 cups of coffee in am , and more sodas   Labs ok  Lab Results  Component Value Date   CHOL 151 12/13/2012   HDL 52.00 12/13/2012   LDLCALC 84 12/13/2012   TRIG 75.0 12/13/2012   CHOLHDL 3 12/13/2012   very good   Hypothyroidism  Pt has no clinical changes No change in energy level/ hair or skin/ edema and no tremor Lab Results  Component Value Date   TSH 3.28 12/13/2012   no missed doses  Smoking is the same Getting ready to quit- stress is lower Would like to try chantix again   Patient Active Problem List   Diagnosis Date Noted  . Unspecified gastritis and gastroduodenitis without mention of hemorrhage 10/04/2012  . Lumbar pain with radiation down right leg 04/12/2012  . Right hip pain 04/12/2012  . Leg strain 02/06/2012  . Routine gynecological examination 12/16/2011  . Sleep disorder 03/21/2011  . Hypothyroid 02/19/2011  . Routine general medical examination at a health care facility 10/31/2010  . Colitis 09/17/2010  . Post-menopausal bleeding 09/17/2010  . Trochanteric bursitis of left hip 09/09/2010  . Sciatica of left side 09/09/2010  . UNSPECIFIED VITAMIN D DEFICIENCY 12/06/2009  . ALLERGIC CONJUNCTIVITIS 10/23/2009  . PULMONARY  NODULE 08/30/2008  . CIGARETTE SMOKER 08/25/2008  . Anxiety 09/01/2006  . C O P D 09/01/2006  . OSTEOPENIA 04/16/2006   Past Medical History  Diagnosis Date  . Anxiety   . COPD (chronic obstructive pulmonary disease)   . Depression   . Thyroid disease   . Osteopenia   . Colitis    Past Surgical History  Procedure Laterality Date  . Dilation and curettage of uterus    . Endometrial biopsy  6/12    Dr Audie Box- negative  . Hysteroscopy  6.7.12    [facility]: ENDOMETRIAL POLYP  . Endomet polyp  6/12    removed    History  Substance Use Topics  . Smoking status: Current Every Day Smoker -- 0.50 packs/day    Types: Cigarettes  . Smokeless tobacco: Not on file  . Alcohol Use: Yes     Comment: rare   Family History  Problem Relation Age of Onset  . Cancer Father     lung cancer and brain cancer  . Breast cancer Sister   . Stroke Maternal Grandfather   . Stroke Paternal Grandfather    Allergies  Allergen Reactions  . Butalbital-Aspirin-Caffeine     REACTION: unspecified  . Fosamax (Alendronate Sodium)     GI  .  Meperidine Hcl     REACTION: unspecified  . Zolpidem Tartrate    Current Outpatient Prescriptions on File Prior to Visit  Medication Sig Dispense Refill  . ALPRAZolam (XANAX) 0.25 MG tablet TAKE 1 TABLET BY MOUTH AT BEDTIME  30 tablet  5  . benzonatate (TESSALON) 200 MG capsule Take 200 mg by mouth 3 (three) times daily as needed.       . Cholecalciferol (VITAMIN D) 400 UNITS capsule Take 400 Units by mouth daily.        [name] levothyroxine (SYNTHROID, LEVOTHROID) 88 MCG tablet Take 88 mcg by mouth daily.        . ranitidine (ZANTAC) 150 MG capsule Take 1 capsule (150 mg total) by mouth 2 (two) times daily.  60 capsule  3   No current facility-administered medications on file prior to visit.    Review of Systems Review of Systems  Constitutional: Negative for fever, appetite change, fatigue and unexpected weight change.  Eyes: Negative for pain and visual  disturbance.  Respiratory: Negative for cough and shortness of breath.   Cardiovascular: Negative for cp or palpitations    Gastrointestinal: Negative for nausea, diarrhea and constipation.  Genitourinary: Negative for urgency and frequency.  Skin: Negative for pallor or rash   Neurological: Negative for weakness, light-headedness, numbness and pos for headaches   Hematological: Negative for adenopathy. Does not bruise/bleed easily.  Psychiatric/Behavioral: Negative for dysphoric mood. The patient is not nervous/anxious.         Objective:   Physical Exam  Constitutional: She appears well-developed and well-nourished. No distress.  HENT:  Head: Normocephalic and atraumatic.  Right Ear: External ear normal.  Left Ear: External ear normal.  Nose: Nose normal.  Mouth/Throat: Oropharynx is clear and moist.  Eyes: Conjunctivae and EOM are normal. Pupils are equal, round, and reactive to light. Right eye exhibits no discharge. Left eye exhibits no discharge. No scleral icterus.  Neck: Normal range of motion. Neck supple. No JVD present. Carotid bruit is not present. No thyromegaly present.  Cardiovascular: Normal rate, regular rhythm, normal heart sounds and intact distal pulses.  Exam reveals no gallop.   Pulmonary/Chest: Effort normal. No respiratory distress. She has no wheezes. She has no rales.  Diffusely distant bs   Abdominal: Soft. Bowel sounds are normal. She exhibits no distension, no abdominal bruit and no mass. There is no tenderness.  Genitourinary: No breast swelling, tenderness, discharge or bleeding.  Breast exam: No mass, nodules, thickening, tenderness, bulging, retraction, inflamation, nipple discharge or skin changes noted.  No axillary or clavicular LA.  Chaperoned exam.    Musculoskeletal: She exhibits no edema and no tenderness.  No kyphosis   Lymphadenopathy:    She has no cervical adenopathy.  Neurological: She is alert. She has normal reflexes. No cranial nerve  deficit. She exhibits normal muscle tone. Coordination normal.  Skin: Skin is warm and dry. No rash noted. No erythema. No pallor.  Psychiatric: She has a normal mood and affect.          Assessment & Plan:

## 2012-12-20 NOTE — Assessment & Plan Note (Signed)
Pt takes xanax prn - rev her toxicology screen

## 2012-12-20 NOTE — Assessment & Plan Note (Signed)
D level is therapeutic with current dose  Disc imp to bone and overall health

## 2012-12-20 NOTE — Assessment & Plan Note (Signed)
Hypothyroidism  Pt has no clinical changes No change in energy level/ hair or skin/ edema and no tremor Lab Results  Component Value Date   TSH 3.28 12/13/2012

## 2012-12-20 NOTE — Assessment & Plan Note (Signed)
Pt intolerant of fosamax -currently on ca and D dexa due next April No fx or falls Will continue to follow Counseled to quit smoking

## 2012-12-20 NOTE — Assessment & Plan Note (Signed)
Reviewed health habits including diet and exercise and skin cancer prevention Also reviewed health mt list, fam hx and immunizations   Wellness labs reviewed in detail  

## 2012-12-20 NOTE — Patient Instructions (Addendum)
Try to back off of caffeine and also all carbonated beverages Continue calcium and vitamin D  Take care of yourself

## 2012-12-28 ENCOUNTER — Encounter: Payer: Self-pay | Admitting: Family Medicine

## 2013-01-19 ENCOUNTER — Telehealth: Payer: Self-pay | Admitting: Family Medicine

## 2013-01-19 NOTE — Telephone Encounter (Signed)
The other option is wellbutrin (it is an antidepressant that also helps with smoking cessation- when it is used for smoking cessation it goes under the name zyban) Have her check with her insurance coverage of that -- she needs to tell them it is being used for smoking cessation-not depression

## 2013-01-19 NOTE — Telephone Encounter (Signed)
From a drug standpoint -that is about it - she will likely need to consider nicotine replacement over the counter or save up for one of the medicines  She can ask her insurance carrier if there is anything for smoking cessation that they would pay for

## 2013-01-19 NOTE — Telephone Encounter (Signed)
Pt notified to check with insurance to see if zyban(wellbutrin) is covered for smoking cessation, and call us back

## 2013-01-19 NOTE — Telephone Encounter (Signed)
The patient called and is hoping to get an alternative to Chantix (spelling..?) to quit smoking due to the the insurance not covering the chantix prescription.  Patient's callback number is - [phone]

## 2013-01-19 NOTE — Telephone Encounter (Signed)
Pt called back, Wellbutrin to treat antidepressant is covered but zyban for smoking cessation is not covered, pt wanted to know if there is anything else she can try

## 2013-01-20 MED ORDER — BUPROPION HCL ER (SMOKING DET) 150 MG PO TB12: ORAL_TABLET | ORAL | Status: DC

## 2013-01-20 NOTE — Telephone Encounter (Signed)
Pt said she checked with insurance and generic zyban may be covered, pt would like [place] to send in the Rx for the zyban and if it's too expensive then she will try to figure something else out because pt used the OTC nicotine patches before an they made pt's craving for cigarettes worse so she doesn't want to do the patches again, please send in Rx for [facility] to [facility] [address]

## 2013-01-20 NOTE — Telephone Encounter (Signed)
I sent the zyban and I hope it works out

## 2013-03-15 ENCOUNTER — Other Ambulatory Visit: Payer: Self-pay | Admitting: *Deleted

## 2013-03-15 MED ORDER — ALPRAZOLAM 0.25 MG PO TABS: ORAL_TABLET | ORAL | Status: DC

## 2013-03-15 NOTE — Telephone Encounter (Signed)
Px written for call in   

## 2013-03-15 NOTE — Telephone Encounter (Signed)
Fax refill request, please advise  

## 2013-03-15 NOTE — Telephone Encounter (Signed)
Rx called in as prescribed 

## 2013-03-31 ENCOUNTER — Ambulatory Visit (INDEPENDENT_AMBULATORY_CARE_PROVIDER_SITE_OTHER): Payer: 59

## 2013-03-31 DIAGNOSIS — Z23 Encounter for immunization: Secondary | ICD-10-CM

## 2013-06-01 ENCOUNTER — Telehealth: Payer: Self-pay | Admitting: *Deleted

## 2013-06-01 NOTE — Telephone Encounter (Signed)
We received a letter from [facility] saying they have been trying to reach pt to schedule her 6 month f/u mammogram.   I left voicemail requesting pt to call office

## 2013-06-01 NOTE — Telephone Encounter (Signed)
Pt left v/m requesting cb [phone]; re; referral to [facility].

## 2013-06-02 NOTE — Telephone Encounter (Signed)
Pt scheduled f/u mammogram for Tuesday 06/07/13 at 2:30

## 2013-06-14 ENCOUNTER — Encounter: Payer: Self-pay | Admitting: Family Medicine

## 2013-07-12 ENCOUNTER — Ambulatory Visit (INDEPENDENT_AMBULATORY_CARE_PROVIDER_SITE_OTHER): Payer: 59 | Admitting: Family Medicine

## 2013-07-12 ENCOUNTER — Encounter: Payer: Self-pay | Admitting: Family Medicine

## 2013-07-12 VITALS — BP 120/76 | HR 75 | Temp 97.9°F | Wt 134.0 lb

## 2013-07-12 DIAGNOSIS — G4762 Sleep related leg cramps: Secondary | ICD-10-CM | POA: Insufficient documentation

## 2013-07-12 DIAGNOSIS — M25461 Effusion, right knee: Secondary | ICD-10-CM | POA: Insufficient documentation

## 2013-07-12 DIAGNOSIS — M25469 Effusion, unspecified knee: Secondary | ICD-10-CM

## 2013-07-12 MED ORDER — BENZONATATE 200 MG PO CAPS: 200.0000 mg | ORAL_CAPSULE | Freq: Three times a day (TID) | ORAL | Status: DC | PRN

## 2013-07-12 NOTE — Progress Notes (Signed)
Subjective:    Patient ID: Regina Ruiz, female    DOB: [birth year], 57 y.o.   MRN: [id number]  HPI Here for a knot on the back of her R knee - it was bigger this am and went down some   She occ gets "little knots" in calves  Some muscle cramping at night  Tried to get more K   No new exercise Has not been back to the gym for a while    Lab Results  Component Value Date   K 4.4 12/13/2012     No reason to have low K No n/v/d  Still smokes - but cutting down - is down to 1/2 ppd (will occ vapor cig)   Patient Active Problem List   Diagnosis Date Noted  . Unspecified gastritis and gastroduodenitis without mention of hemorrhage 10/04/2012  . Lumbar pain with radiation down right leg 04/12/2012  . Right hip pain 04/12/2012  . Leg strain 02/06/2012  . Routine gynecological examination 12/16/2011  . Sleep disorder 03/21/2011  . Hypothyroid 02/19/2011  . Routine general medical examination at a health care facility 10/31/2010  . Colitis 09/17/2010  . Post-menopausal bleeding 09/17/2010  . Trochanteric bursitis of left hip 09/09/2010  . Sciatica of left side 09/09/2010  . UNSPECIFIED VITAMIN D DEFICIENCY 12/06/2009  . ALLERGIC CONJUNCTIVITIS 10/23/2009  . PULMONARY NODULE 08/30/2008  . CIGARETTE SMOKER 08/25/2008  . Anxiety 09/01/2006  . C O P D 09/01/2006  . OSTEOPENIA 04/16/2006   Past Medical History  Diagnosis Date  . Anxiety   . COPD (chronic obstructive pulmonary disease)   . Depression   . Thyroid disease   . Osteopenia   . Colitis    Past Surgical History  Procedure Laterality Date  . Dilation and curettage of uterus    . Endometrial biopsy  6/12    Dr Phineas Real- negative  . Hysteroscopy  6.7.12    [facility]: ENDOMETRIAL POLYP  . Endomet polyp  6/12    removed    History  Substance Use Topics  . Smoking status: Current Every Day Smoker -- 0.50 packs/day    Types: Cigarettes  . Smokeless tobacco: Never Used  . Alcohol Use: Yes     Comment: rare    Family History  Problem Relation Age of Onset  . Cancer Father     lung cancer and brain cancer  . Breast cancer Sister   . Stroke Maternal Grandfather   . Stroke Paternal Grandfather    Allergies  Allergen Reactions  . Butalbital-Aspirin-Caffeine     REACTION: unspecified  . Fosamax [Alendronate Sodium]     GI  . Meperidine Hcl     REACTION: unspecified  . Zolpidem Tartrate    Current Outpatient Prescriptions on File Prior to Visit  Medication Sig Dispense Refill  . ALPRAZolam (XANAX) 0.25 MG tablet TAKE 1 TABLET BY MOUTH AT BEDTIME  30 tablet  5  . benzonatate (TESSALON) 200 MG capsule Take 200 mg by mouth 3 (three) times daily as needed.       [name] buPROPion (ZYBAN) 150 MG 12 hr tablet Take 1 pill by mouth daily for 1 week and then increase it to twice daily  60 tablet  3  . Cholecalciferol (VITAMIN D) 400 UNITS capsule Take 400 Units by mouth daily.        [name] levothyroxine (SYNTHROID, LEVOTHROID) 88 MCG tablet Take 88 mcg by mouth daily.        . ranitidine (ZANTAC) 150 MG capsule Take  1 capsule (150 mg total) by mouth 2 (two) times daily.  60 capsule  3  . SUMAtriptan (IMITREX) 100 MG tablet Take 1 pill orally for migraine - if necessary may repeat once in 2 hours  10 tablet  3   No current facility-administered medications on file prior to visit.     Review of Systems    Review of Systems  Constitutional: Negative for fever, appetite change, fatigue and unexpected weight change.  Eyes: Negative for pain and visual disturbance.  Respiratory: Negative for cough and shortness of breath.   Cardiovascular: Negative for cp or palpitations    Gastrointestinal: Negative for nausea, diarrhea and constipation.  Genitourinary: Negative for urgency and frequency.  Skin: Negative for pallor or rash MSK pos for swelling behind knee and nocturnal leg cramps    Neurological: Negative for weakness, light-headedness, numbness and headaches.  Hematological: Negative for adenopathy. Does  not bruise/bleed easily.  Psychiatric/Behavioral: Negative for dysphoric mood. The patient is not nervous/anxious.      Objective:   Physical Exam  Constitutional: She appears well-developed and well-nourished.  HENT:  Head: Normocephalic and atraumatic.  Eyes: Conjunctivae and EOM are normal. Pupils are equal, round, and reactive to light.  Neck: Normal range of motion. Neck supple.  Cardiovascular: Normal rate, regular rhythm and intact distal pulses.   Musculoskeletal:  Slight fullness behind R knee that is non tender  No varicosities No palp cords No other focal swelling/redness/heat Neg homans sign     Lymphadenopathy:    She has no cervical adenopathy.  Skin: Skin is warm and dry. No rash noted. No erythema.  Psychiatric: She has a normal mood and affect.          Assessment & Plan:

## 2013-07-12 NOTE — Progress Notes (Signed)
Pre-visit discussion using our clinic review tool. No additional management support is needed unless otherwise documented below in the visit note.  

## 2013-07-12 NOTE — Patient Instructions (Signed)
Stop up front to get a referral for an ultrasound of your leg - to rule out a baker's cyst behind the knee  For cramping - try over the counter magnesium supplement 1 daily and also tonic water 4 oz per day  Stretch legs and calves and get back to exercise  Keep working on quitting smoking

## 2013-07-13 ENCOUNTER — Ambulatory Visit (HOSPITAL_BASED_OUTPATIENT_CLINIC_OR_DEPARTMENT_OTHER): Payer: 59

## 2013-07-13 ENCOUNTER — Telehealth: Payer: Self-pay | Admitting: *Deleted

## 2013-07-13 NOTE — Assessment & Plan Note (Signed)
Discussed stretching/ getting back to exercise  Use of otc magnesium  Tonic water  Nl K last check  Update if no improvement

## 2013-07-13 NOTE — Assessment & Plan Note (Signed)
Post fullness  Check with an ultrasound - to r/u Baker's cyst

## 2013-07-13 NOTE — Telephone Encounter (Signed)
Imaging left voicemail letting you know that the pt cancelled her [place] appt. due to insurance/cost of imaging, they just wanted to let you know

## 2013-07-13 NOTE — Telephone Encounter (Signed)
Thanks for the update - have her let me know if the area behind knee becomes larger or uncomfortable

## 2013-07-13 NOTE — Telephone Encounter (Signed)
Chris with imaging called and said that the [place] pt is having is coded like it's check for a blood clot instead of bakers cyst, Gerald Stabs wanted to know for coding did you want them to check for a blood clot or bakers cyst

## 2013-07-13 NOTE — Telephone Encounter (Signed)
Baker's cyst please

## 2013-07-14 NOTE — Telephone Encounter (Signed)
Pt notified to update [place] of place behind knee becomes larger or uncomfortable, pt verbalized understanding

## 2013-07-19 ENCOUNTER — Telehealth: Payer: Self-pay | Admitting: Family Medicine

## 2013-07-19 NOTE — Telephone Encounter (Signed)
Relevant patient education assigned to patient using Emmi. ° °

## 2013-09-14 ENCOUNTER — Other Ambulatory Visit: Payer: Self-pay | Admitting: *Deleted

## 2013-09-14 MED ORDER — ALPRAZOLAM 0.25 MG PO TABS: ORAL_TABLET | ORAL | Status: DC

## 2013-09-14 NOTE — Telephone Encounter (Signed)
Px written for call in   

## 2013-09-14 NOTE — Telephone Encounter (Signed)
Refill request for alprazolam Last filled by MD on - 03/15/2013 #30 x5 Last Appt: 07/12/2013 Next Appt: 12/21/2013 Please advise refill?

## 2013-09-14 NOTE — Telephone Encounter (Signed)
Called to [facility] [address] 

## 2013-09-29 ENCOUNTER — Encounter: Payer: Self-pay | Admitting: Internal Medicine

## 2013-09-29 ENCOUNTER — Ambulatory Visit (INDEPENDENT_AMBULATORY_CARE_PROVIDER_SITE_OTHER): Payer: 59 | Admitting: Internal Medicine

## 2013-09-29 VITALS — BP 126/78 | Temp 97.5°F | Resp 80 | Wt 133.2 lb

## 2013-09-29 DIAGNOSIS — F32A Depression, unspecified: Secondary | ICD-10-CM

## 2013-09-29 DIAGNOSIS — F341 Dysthymic disorder: Secondary | ICD-10-CM

## 2013-09-29 DIAGNOSIS — F419 Anxiety disorder, unspecified: Principal | ICD-10-CM

## 2013-09-29 DIAGNOSIS — F329 Major depressive disorder, single episode, unspecified: Secondary | ICD-10-CM

## 2013-09-29 MED ORDER — FLUOXETINE HCL 20 MG PO TABS: 20.0000 mg | ORAL_TABLET | Freq: Every day | ORAL | Status: DC

## 2013-09-29 MED ORDER — ALPRAZOLAM 0.25 MG PO TABS: 0.2500 mg | ORAL_TABLET | Freq: Two times a day (BID) | ORAL | Status: DC | PRN

## 2013-09-29 NOTE — Patient Instructions (Addendum)

## 2013-09-29 NOTE — Progress Notes (Signed)
Subjective:    Patient ID: Regina Ruiz, female    DOB: September 29, 1955, 58 y.o.   MRN: [id number]  HPI  Pt presents to the clinic today with c/o anxiety. She repots this started 2 weeks ago. She reports that it is situational. Her boyfriend recently told her that he was planning on cheating on her. This has made her very anxious and depressed. She denies SI/HI. She has been taking her xanax BID instead of daily as prescribed. She would like to know what else she can do because she can't live like this.  Review of Systems      Past Medical History  Diagnosis Date  . Anxiety   . COPD (chronic obstructive pulmonary disease)   . Depression   . Thyroid disease   . Osteopenia   . Colitis     Current Outpatient Prescriptions  Medication Sig Dispense Refill  . Cholecalciferol (VITAMIN D) 400 UNITS capsule Take 400 Units by mouth daily.        [name] levothyroxine (SYNTHROID, LEVOTHROID) 88 MCG tablet Take 88 mcg by mouth daily.        . ranitidine (ZANTAC) 150 MG capsule Take 1 capsule (150 mg total) by mouth 2 (two) times daily.  60 capsule  3  . SUMAtriptan (IMITREX) 100 MG tablet Take 1 pill orally for migraine - if necessary may repeat once in 2 hours  10 tablet  3  . ALPRAZolam (XANAX) 0.25 MG tablet Take 1 tablet (0.25 mg total) by mouth 2 (two) times daily as needed for anxiety.  60 tablet  0  . FLUoxetine (PROZAC) 20 MG tablet Take 1 tablet (20 mg total) by mouth daily.  30 tablet  0   No current facility-administered medications for this visit.    Allergies  Allergen Reactions  . Butalbital-Aspirin-Caffeine     REACTION: unspecified  . Fosamax [Alendronate Sodium]     GI  . Meperidine Hcl     REACTION: unspecified  . Zolpidem Tartrate     Family History  Problem Relation Age of Onset  . Cancer Father     lung cancer and brain cancer  . Breast cancer Sister   . Stroke Maternal Grandfather   . Stroke Paternal Grandfather     History   Social History  . Marital  Status: Legally Separated    Spouse Name: N/A    Number of Children: 1  . Years of Education: N/A   Occupational History  . Not on file.   Social History Main Topics  . Smoking status: Current Every Day Smoker -- 0.50 packs/day    Types: Cigarettes  . Smokeless tobacco: Never Used  . Alcohol Use: Yes     Comment: rare  . Drug Use: No  . Sexual Activity: Not on file   Other Topics Concern  . Not on file   Social History Narrative   Husband is chronically ill with CVA and alcoholism---she left him in fall of 2011     Constitutional: Denies fever, malaise, fatigue, headache or abrupt weight changes.  Psych: Pt reports anxiety and depression. Denies SI/HI.  No other specific complaints in a complete review of systems (except as listed in HPI above).  Objective:   Physical Exam   BP 126/78  Temp(Src) 97.5 F (36.4 C) (Oral)  Resp 80  Wt 133 lb 4 oz (60.442 kg)  SpO2 97%  LMP 06/16/2005 Wt Readings from Last 3 Encounters:  09/29/13 133 lb 4 oz (60.442 kg)  07/12/13  134 lb (60.782 kg)  12/20/12 134 lb 4 oz (60.895 kg)    General: Appears her stated age, well developed, well nourished in NAD. Cardiovascular: Normal rate and rhythm. S1,S2 noted.  No murmur, rubs or gallops noted. No JVD or BLE edema. No carotid bruits noted. Pulmonary/Chest: Normal effort and positive vesicular breath sounds. No respiratory distress. No wheezes, rales or ronchi noted.  Psychiatric: Mood very anxious today and affect normal. Behavior is normal. Judgment and thought content normal.     BMET    Component Value Date/Time   NA 139 12/13/2012 0756   K 4.4 12/13/2012 0756   CL 105 12/13/2012 0756   CO2 29 12/13/2012 0756   GLUCOSE 88 12/13/2012 0756   BUN 12 12/13/2012 0756   CREATININE 1.0 12/13/2012 0756   CALCIUM 8.7 12/13/2012 0756   GFRNONAA >60 09/13/2010 0815   GFRAA  Value: >60        The eGFR has been calculated using the MDRD equation. This calculation has not been validated in all  clinical situations. eGFR's persistently <60 mL/min signify possible Chronic Kidney Disease. 09/13/2010 0815    Lipid Panel     Component Value Date/Time   CHOL 151 12/13/2012 0756   TRIG 75.0 12/13/2012 0756   HDL 52.00 12/13/2012 0756   CHOLHDL 3 12/13/2012 0756   VLDL 15.0 12/13/2012 0756   LDLCALC 84 12/13/2012 0756    CBC    Component Value Date/Time   WBC 8.3 12/13/2012 0756   RBC 4.36 12/13/2012 0756   HGB 13.8 12/13/2012 0756   HCT 40.3 12/13/2012 0756   PLT 261.0 12/13/2012 0756   MCV 92.6 12/13/2012 0756   MCH 31.2 09/13/2010 0815   MCHC 34.3 12/13/2012 0756   RDW 12.4 12/13/2012 0756   LYMPHSABS 1.8 12/13/2012 0756   MONOABS 0.6 12/13/2012 0756   EOSABS 0.1 12/13/2012 0756   BASOSABS 0.1 12/13/2012 0756    Hgb A1C No results found for this basename: HGBA1C        Assessment & Plan:   Situation anxiety:  Will start prozac 20 mg daily Will increase xanax to BID- she understand this is only temporary until the prozac kicks in  RTC in 1 month to followup anxiety

## 2013-10-04 ENCOUNTER — Encounter: Payer: Self-pay | Admitting: Family Medicine

## 2013-10-04 ENCOUNTER — Ambulatory Visit (INDEPENDENT_AMBULATORY_CARE_PROVIDER_SITE_OTHER): Payer: 59 | Admitting: Family Medicine

## 2013-10-04 VITALS — BP 138/80 | HR 74 | Temp 97.9°F | Wt 130.0 lb

## 2013-10-04 DIAGNOSIS — Z113 Encounter for screening for infections with a predominantly sexual mode of transmission: Secondary | ICD-10-CM

## 2013-10-04 DIAGNOSIS — F419 Anxiety disorder, unspecified: Secondary | ICD-10-CM

## 2013-10-04 DIAGNOSIS — F411 Generalized anxiety disorder: Secondary | ICD-10-CM

## 2013-10-04 DIAGNOSIS — N760 Acute vaginitis: Secondary | ICD-10-CM

## 2013-10-04 MED ORDER — METRONIDAZOLE 500 MG PO TABS: 500.0000 mg | ORAL_TABLET | Freq: Two times a day (BID) | ORAL | Status: DC

## 2013-10-04 NOTE — Patient Instructions (Signed)
Labs today  Take flagyl for bacterial vaginosis  If symptoms worsen or do not improve let me know  Take care of yourself

## 2013-10-04 NOTE — Progress Notes (Signed)
Subjective:    Patient ID: Regina Ruiz, female    DOB: Nov 24, 1955, 58 y.o.   MRN: [id number]  HPI Issues with infidelity in her current relationship Came and saw Rollene Fare  Put her on xanax and also prozac for the stress- it really floored her  Not eating well  She declined and declines counseling -has a lot of support  Has not had STD tests   Now she is having some vaginal burning  No itch No discharge  ? If she could have had exposure to an STD   (not known) No lesions or abd pain    Had arm fx in Feb at work  Did not need surgery -still doing therapy  Patient Active Problem List   Diagnosis Date Noted  . Screen for STD (sexually transmitted disease) 10/04/2013  . Vaginitis 10/04/2013  . Swelling of joint of right knee 07/12/2013  . Nocturnal leg cramps 07/12/2013  . Unspecified gastritis and gastroduodenitis without mention of hemorrhage 10/04/2012  . Leg strain 02/06/2012  . Routine gynecological examination 12/16/2011  . Sleep disorder 03/21/2011  . Hypothyroid 02/19/2011  . Routine general medical examination at a health care facility 10/31/2010  . Colitis 09/17/2010  . Trochanteric bursitis of left hip 09/09/2010  . Sciatica of left side 09/09/2010  . UNSPECIFIED VITAMIN D DEFICIENCY 12/06/2009  . ALLERGIC CONJUNCTIVITIS 10/23/2009  . PULMONARY NODULE 08/30/2008  . CIGARETTE SMOKER 08/25/2008  . Anxiety 09/01/2006  . C O P D 09/01/2006  . OSTEOPENIA 04/16/2006   Past Medical History  Diagnosis Date  . Anxiety   . COPD (chronic obstructive pulmonary disease)   . Depression   . Thyroid disease   . Osteopenia   . Colitis    Past Surgical History  Procedure Laterality Date  . Dilation and curettage of uterus    . Endometrial biopsy  6/12    Dr Phineas Real- negative  . Hysteroscopy  6.7.12    [facility]: ENDOMETRIAL POLYP  . Endomet polyp  6/12    removed    History  Substance Use Topics  . Smoking status: Current Every Day Smoker -- 0.50 packs/day   Types: Cigarettes  . Smokeless tobacco: Never Used  . Alcohol Use: Yes     Comment: rare   Family History  Problem Relation Age of Onset  . Cancer Father     lung cancer and brain cancer  . Breast cancer Sister   . Stroke Maternal Grandfather   . Stroke Paternal Grandfather    Allergies  Allergen Reactions  . Butalbital-Aspirin-Caffeine     REACTION: unspecified  . Fosamax [Alendronate Sodium]     GI  . Meperidine Hcl     REACTION: unspecified  . Zolpidem Tartrate    Current Outpatient Prescriptions on File Prior to Visit  Medication Sig Dispense Refill  . ALPRAZolam (XANAX) 0.25 MG tablet Take 1 tablet (0.25 mg total) by mouth 2 (two) times daily as needed for anxiety.  60 tablet  0  . Cholecalciferol (VITAMIN D) 400 UNITS capsule Take 400 Units by mouth daily.        [name] FLUoxetine (PROZAC) 20 MG tablet Take 1 tablet (20 mg total) by mouth daily.  30 tablet  0  . levothyroxine (SYNTHROID, LEVOTHROID) 88 MCG tablet Take 88 mcg by mouth daily.        . ranitidine (ZANTAC) 150 MG capsule Take 1 capsule (150 mg total) by mouth 2 (two) times daily.  60 capsule  3  . SUMAtriptan (IMITREX)  100 MG tablet Take 1 pill orally for migraine - if necessary may repeat once in 2 hours  10 tablet  3   No current facility-administered medications on file prior to visit.      Review of Systems Review of Systems  Constitutional: Negative for fever, appetite change, fatigue and unexpected weight change.  Eyes: Negative for pain and visual disturbance.  Respiratory: Negative for cough and shortness of breath.   Cardiovascular: Negative for cp or palpitations    Gastrointestinal: Negative for nausea, diarrhea and constipation.  Genitourinary: Negative for urgency and frequency. pos for vaginal burning without d/c or lesions Skin: Negative for pallor or rash   Neurological: Negative for weakness, light-headedness, numbness and headaches.  Hematological: Negative for adenopathy. Does not  bruise/bleed easily.  Psychiatric/Behavioral: Negative for dysphoric mood. The patient is not nervous/anxious.         Objective:   Physical Exam  Constitutional: She appears well-developed and well-nourished. No distress.  HENT:  Head: Normocephalic and atraumatic.  Eyes: Conjunctivae and EOM are normal. Pupils are equal, round, and reactive to light. Right eye exhibits no discharge. Left eye exhibits no discharge.  Neck: Normal range of motion. Neck supple.  Cardiovascular: Normal rate and regular rhythm.   Genitourinary: There is no rash, tenderness, lesion or injury on the right labia. There is no rash, tenderness, lesion or injury on the left labia. Uterus is not enlarged and not tender. Cervix exhibits no motion tenderness. There is erythema around the vagina. No tenderness or bleeding around the vagina. No signs of injury around the vagina. No vaginal discharge found.  No vaginal d/c or lesions  Wet prep pos for clue cells   Musculoskeletal: She exhibits no edema.  Lymphadenopathy:    She has no cervical adenopathy.  Neurological: She is alert.  Skin: Skin is warm and dry. No rash noted. No pallor.  Psychiatric: She has a normal mood and affect.          Assessment & Plan:

## 2013-10-04 NOTE — Progress Notes (Signed)
Pre visit review using our clinic review tool, if applicable. No additional management support is needed unless otherwise documented below in the visit note. 

## 2013-10-04 NOTE — Progress Notes (Deleted)
Pre visit review using our clinic review tool, if applicable. No additional management support is needed unless otherwise documented below in the visit note. 

## 2013-10-05 LAB — POCT WET PREP (WET MOUNT)
Bacteria Wet Prep HPF POC: POSITIVE
KOH Wet Prep POC: NEGATIVE

## 2013-10-05 LAB — GC/CHLAMYDIA PROBE AMP
CT Probe RNA: NEGATIVE
GC PROBE AMP APTIMA: NEGATIVE

## 2013-10-05 LAB — HIV ANTIBODY (ROUTINE TESTING W REFLEX): HIV: NONREACTIVE

## 2013-10-05 LAB — RPR

## 2013-10-05 NOTE — Assessment & Plan Note (Signed)
BV- treated with flagyl oral  Wet prep with clue cells STD screen pending  Update if not starting to improve in a week or if worsening

## 2013-10-05 NOTE — Assessment & Plan Note (Signed)
Pt in recent relationship with infidelity Std screen incl gc/ chl/hiv and RPR tx for BV Update

## 2013-10-05 NOTE — Assessment & Plan Note (Signed)
Recently worsened after break up / infidelity in relationship Doing better with prozac and xanax now  F/u planned

## 2013-11-01 ENCOUNTER — Ambulatory Visit (INDEPENDENT_AMBULATORY_CARE_PROVIDER_SITE_OTHER): Payer: 59 | Admitting: Family Medicine

## 2013-11-01 ENCOUNTER — Encounter: Payer: Self-pay | Admitting: Family Medicine

## 2013-11-01 VITALS — BP 132/60 | HR 66 | Temp 97.9°F | Ht 63.0 in | Wt 125.8 lb

## 2013-11-01 DIAGNOSIS — F411 Generalized anxiety disorder: Secondary | ICD-10-CM

## 2013-11-01 DIAGNOSIS — F419 Anxiety disorder, unspecified: Secondary | ICD-10-CM

## 2013-11-01 MED ORDER — FLUOXETINE HCL 20 MG PO TABS: 20.0000 mg | ORAL_TABLET | Freq: Every day | ORAL | Status: DC

## 2013-11-01 MED ORDER — ALPRAZOLAM 0.25 MG PO TABS: 0.2500 mg | ORAL_TABLET | Freq: Two times a day (BID) | ORAL | Status: DC | PRN

## 2013-11-01 NOTE — Progress Notes (Signed)
Pre visit review using our clinic review tool, if applicable. No additional management support is needed unless otherwise documented below in the visit note. 

## 2013-11-01 NOTE — Progress Notes (Signed)
Subjective:    Patient ID: Regina Ruiz, female    DOB: 21-Mar-1956, 58 y.o.   MRN: [id number]  HPI Here for f/u of anxiety   She is feeling much better   Office Visit on 10/04/2013  Component Date Value Ref Range Status  . Source Wet Prep POC 10/05/2013 vaginal   Final  . WBC, Wet Prep HPF POC 10/05/2013 mod   Final  . Bacteria Wet Prep HPF POC 10/05/2013 pos   Final  . Clue Cells Wet Prep HPF POC 10/05/2013 Many   Final  . Yeast Wet Prep HPF POC 10/05/2013 None   Final  . KOH Wet Prep POC 10/05/2013 neg   Final  . Trichomonas Wet Prep HPF POC 10/05/2013 none   Final  . HIV 1&2 Ab, 4th Generation 10/04/2013 NONREACTIVE  NONREACTIVE Final   Comment:                            A NONREACTIVE HIV Ag/Ab result does not exclude HIV infection since                          the time frame for seroconversion is variable. If acute HIV infection                          is suspected, a HIV-1 RNA Qualitative TMA test is recommended.                                                     HIV-1/2 Antibody Diff         Not indicated.                          HIV-1 RNA, Qual TMA           Not indicated.                                                     PLEASE NOTE: This information has been disclosed to you from records                          whose confidentiality may be protected by state law. If your state                          requires such protection, then the state law prohibits you from making                          any further disclosure of the information without the specific written                          consent of the person to whom it pertains, or as otherwise permitted                          by law. A general authorization for the  release of medical or other                          information is NOT sufficient for this purpose.                                                     The performance of this assay has not been clinically validated in   patients less than 62 years old.  . RPR 10/04/2013 NON REAC  NON REAC Final  . CT Probe RNA 10/04/2013 NEGATIVE   Final  . GC Probe RNA 10/04/2013 NEGATIVE   Final   Comment:                                                                                                                  **Normal Reference Range: Negative**                                                           Assay performed using the Gen-Probe APTIMA COMBO2 (R) Assay.                                                     Acceptable specimen types for this assay include APTIMA Swabs (Unisex,                          endocervical, urethral, or vaginal), first void urine, and ThinPrep                          liquid based cytology samples.   happy all her tests are neg  Took flagyl for her BV   Anxiety  The prozac helped her anxiety - but did not help sleep all that much (still up and down)  She may want to change her dose to am  Takes xanax prn - tends to take 1/2 pill at a time -monitoring it closely   Social situation - she is still getting texts from her ex (who cheated on her) - she blocked his number from her cell phone  She has to see him at work- she does ok with that  She is trying to do some things for herself  Spending time with her niece and her new 49 year old- that is great and makes her happy  Appetite is up and down-lost 5 lb - she is trying not to skip meals (bmi is 86)  Just had thyroid visit in feb  Was tx for hyperthyroidism and now is hypothyroid  She has to take the name brand of med  Endocrinologist is watching her closely   Patient Active Problem List   Diagnosis Date Noted  . Screen for STD (sexually transmitted disease) 10/04/2013  . Vaginitis 10/04/2013  . Swelling of joint of right knee 07/12/2013  . Nocturnal leg cramps 07/12/2013  . Unspecified gastritis and gastroduodenitis without mention of hemorrhage 10/04/2012  . Leg strain 02/06/2012  . Routine gynecological examination  12/16/2011  . Sleep disorder 03/21/2011  . Hypothyroid 02/19/2011  . Routine general medical examination at a health care facility 10/31/2010  . Colitis 09/17/2010  . Trochanteric bursitis of left hip 09/09/2010  . Sciatica of left side 09/09/2010  . UNSPECIFIED VITAMIN D DEFICIENCY 12/06/2009  . ALLERGIC CONJUNCTIVITIS 10/23/2009  . PULMONARY NODULE 08/30/2008  . CIGARETTE SMOKER 08/25/2008  . Anxiety 09/01/2006  . C O P D 09/01/2006  . OSTEOPENIA 04/16/2006   Past Medical History  Diagnosis Date  . Anxiety   . COPD (chronic obstructive pulmonary disease)   . Depression   . Thyroid disease   . Osteopenia   . Colitis    Past Surgical History  Procedure Laterality Date  . Dilation and curettage of uterus    . Endometrial biopsy  6/12    Dr Phineas Real- negative  . Hysteroscopy  6.7.12    [facility]: ENDOMETRIAL POLYP  . Endomet polyp  6/12    removed    History  Substance Use Topics  . Smoking status: Current Every Day Smoker -- 0.50 packs/day    Types: Cigarettes  . Smokeless tobacco: Never Used  . Alcohol Use: Yes     Comment: rare   Family History  Problem Relation Age of Onset  . Cancer Father     lung cancer and brain cancer  . Breast cancer Sister   . Stroke Maternal Grandfather   . Stroke Paternal Grandfather    Allergies  Allergen Reactions  . Butalbital-Aspirin-Caffeine     REACTION: unspecified  . Fosamax [Alendronate Sodium]     GI  . Meperidine Hcl     REACTION: unspecified  . Zolpidem Tartrate    Current Outpatient Prescriptions on File Prior to Visit  Medication Sig Dispense Refill  . Cholecalciferol (VITAMIN D) 400 UNITS capsule Take 400 Units by mouth daily.        [name] levothyroxine (SYNTHROID, LEVOTHROID) 88 MCG tablet Take 88 mcg by mouth daily.        . Multiple Vitamin (STRESS B PO) Take 2 tablets by mouth 2 (two) times daily.      . ranitidine (ZANTAC) 150 MG capsule Take 1 capsule (150 mg total) by mouth 2 (two) times daily.  60 capsule  3    . SUMAtriptan (IMITREX) 100 MG tablet Take 1 pill orally for migraine - if necessary may repeat once in 2 hours  10 tablet  3   No current facility-administered medications on file prior to visit.    Review of Systems Review of Systems  Constitutional: Negative for fever, appetite change, fatigue and unexpected weight change.  Eyes: Negative for pain and visual disturbance.  Respiratory: Negative for cough and shortness of breath.   Cardiovascular: Negative for cp or palpitations    Gastrointestinal: Negative for nausea, diarrhea and constipation.  Genitourinary: Negative for urgency and frequency.  Skin: Negative for pallor or rash   Neurological: Negative for weakness, light-headedness, numbness and headaches.  Hematological: Negative for adenopathy. Does not bruise/bleed easily.  Psychiatric/Behavioral: Negative for dysphoric mood. The patient is  nervous/anxious.  (this is improved)       Objective:   Physical Exam  Constitutional: She is oriented to person, place, and time. She appears well-developed and well-nourished. No distress.  HENT:  Head: Normocephalic and atraumatic.  Mouth/Throat: Oropharynx is clear and moist.  Eyes: Conjunctivae and EOM are normal. Pupils are equal, round, and reactive to light. No scleral icterus.  Neck: Normal range of motion. Neck supple. No JVD present. Carotid bruit is not present. No thyromegaly present.  Cardiovascular: Normal rate and regular rhythm.   Pulmonary/Chest: Effort normal and breath sounds normal. No respiratory distress. She has no wheezes.  Diffusely distant bs   Musculoskeletal: She exhibits no edema.  Lymphadenopathy:    She has no cervical adenopathy.  Neurological: She is alert and oriented to person, place, and time. No cranial nerve deficit. She exhibits normal muscle tone. Coordination normal.  No tremor   Brisk DTRs  Skin: Skin is warm and dry. No rash noted. No pallor.  Psychiatric: Her speech is normal and  behavior is normal. Her mood appears anxious. Her affect is not blunt, not labile and not inappropriate. Thought content is not paranoid. She does not exhibit a depressed mood. She expresses no homicidal and no suicidal ideation.          Assessment & Plan:

## 2013-11-01 NOTE — Patient Instructions (Signed)
Continue the prozac- try dosing it in the morning instead of the evening  Xanax as needed  Eat healthy/ don't miss meals and take care of yourself  If you decide you are interested in counseling -let me know -I'm glad you have good support

## 2013-11-01 NOTE — Assessment & Plan Note (Signed)
Improved with prozac and prn xanax Reviewed stressors/ coping techniques/symptoms/ support sources/ tx options and side effects in detail today Disc social situation in detail as well  Will continue current meds She has good support and declines counseling

## 2013-12-03 ENCOUNTER — Telehealth: Payer: Self-pay | Admitting: Family Medicine

## 2013-12-03 DIAGNOSIS — Z0000 Encounter for general adult medical examination without abnormal findings: Secondary | ICD-10-CM

## 2013-12-03 DIAGNOSIS — E559 Vitamin D deficiency, unspecified: Secondary | ICD-10-CM

## 2013-12-03 DIAGNOSIS — M899 Disorder of bone, unspecified: Secondary | ICD-10-CM

## 2013-12-03 DIAGNOSIS — M949 Disorder of cartilage, unspecified: Principal | ICD-10-CM

## 2013-12-03 NOTE — Telephone Encounter (Signed)
Message copied by Abner Greenspan on Sat Dec 03, 2013  4:39 PM ------      Message from: Ellamae Sia      Created: Tue Nov 29, 2013  4:40 PM      Regarding: lab orders       Patient is scheduled for CPX labs, please order future labs, Thanks , Regina Ruiz       ------

## 2013-12-05 ENCOUNTER — Telehealth: Payer: Self-pay | Admitting: Family Medicine

## 2013-12-05 NOTE — Telephone Encounter (Signed)
Electronic refill request, last refilled on 11/01/13, please advise (Dr. Glori Bickers out of office)

## 2013-12-06 ENCOUNTER — Other Ambulatory Visit: Payer: Self-pay

## 2013-12-06 NOTE — Telephone Encounter (Signed)
Pt called back to speak with me and when I picked up 101 there was no one there. Left v/m requesting cb.

## 2013-12-06 NOTE — Telephone Encounter (Signed)
Please see 12/05/13 note.

## 2013-12-06 NOTE — Telephone Encounter (Signed)
Please call in.  Thanks.   

## 2013-12-06 NOTE — Telephone Encounter (Signed)
Rx called in to pharmacy. 

## 2013-12-06 NOTE — Telephone Encounter (Signed)
Pt left v/m requesting status of refill; left v/m advising to ck with [facility] [address]

## 2013-12-09 NOTE — Telephone Encounter (Signed)
Spoke with pt and she got her medication; nothing further needed.

## 2013-12-13 ENCOUNTER — Encounter: Payer: Self-pay | Admitting: Family Medicine

## 2013-12-15 ENCOUNTER — Other Ambulatory Visit: Payer: BC Managed Care – PPO

## 2013-12-15 ENCOUNTER — Other Ambulatory Visit (INDEPENDENT_AMBULATORY_CARE_PROVIDER_SITE_OTHER): Payer: 59

## 2013-12-15 DIAGNOSIS — Z0000 Encounter for general adult medical examination without abnormal findings: Secondary | ICD-10-CM

## 2013-12-15 DIAGNOSIS — E039 Hypothyroidism, unspecified: Secondary | ICD-10-CM

## 2013-12-15 DIAGNOSIS — M899 Disorder of bone, unspecified: Secondary | ICD-10-CM

## 2013-12-15 DIAGNOSIS — E559 Vitamin D deficiency, unspecified: Secondary | ICD-10-CM

## 2013-12-15 DIAGNOSIS — M949 Disorder of cartilage, unspecified: Secondary | ICD-10-CM

## 2013-12-15 LAB — CBC WITH DIFFERENTIAL/PLATELET
BASOS PCT: 0.2 % (ref 0.0–3.0)
Basophils Absolute: 0 10*3/uL (ref 0.0–0.1)
EOS ABS: 0.1 10*3/uL (ref 0.0–0.7)
Eosinophils Relative: 0.6 % (ref 0.0–5.0)
HEMATOCRIT: 43.6 % (ref 36.0–46.0)
HEMOGLOBIN: 14.9 g/dL (ref 12.0–15.0)
LYMPHS ABS: 1.3 10*3/uL (ref 0.7–4.0)
LYMPHS PCT: 13.7 % (ref 12.0–46.0)
MCHC: 34.2 g/dL (ref 30.0–36.0)
MCV: 91.3 fl (ref 78.0–100.0)
MONO ABS: 0.6 10*3/uL (ref 0.1–1.0)
Monocytes Relative: 6.7 % (ref 3.0–12.0)
NEUTROS ABS: 7.3 10*3/uL (ref 1.4–7.7)
Neutrophils Relative %: 78.8 % — ABNORMAL HIGH (ref 43.0–77.0)
Platelets: 308 10*3/uL (ref 150.0–400.0)
RBC: 4.77 Mil/uL (ref 3.87–5.11)
RDW: 12.6 % (ref 11.5–15.5)
WBC: 9.2 10*3/uL (ref 4.0–10.5)

## 2013-12-15 LAB — COMPREHENSIVE METABOLIC PANEL
ALT: 12 U/L (ref 0–35)
AST: 18 U/L (ref 0–37)
Albumin: 4 g/dL (ref 3.5–5.2)
Alkaline Phosphatase: 108 U/L (ref 39–117)
BILIRUBIN TOTAL: 0.7 mg/dL (ref 0.2–1.2)
BUN: 10 mg/dL (ref 6–23)
CALCIUM: 9 mg/dL (ref 8.4–10.5)
CHLORIDE: 101 meq/L (ref 96–112)
CO2: 28 mEq/L (ref 19–32)
CREATININE: 0.9 mg/dL (ref 0.4–1.2)
GFR: 66.99 mL/min (ref >60.00)
Glucose, Bld: 94 mg/dL (ref 70–99)
Potassium: 4 mEq/L (ref 3.5–5.1)
Sodium: 135 mEq/L (ref 135–145)
Total Protein: 6.9 g/dL (ref 6.0–8.3)

## 2013-12-15 LAB — TSH: TSH: 0.37 u[IU]/mL (ref 0.35–4.50)

## 2013-12-15 LAB — LIPID PANEL
CHOL/HDL RATIO: 3
Cholesterol: 151 mg/dL (ref 0–200)
HDL: 46.7 mg/dL (ref >39.00)
LDL Cholesterol: 81 mg/dL (ref 0–99)
NonHDL: 104.3
TRIGLYCERIDES: 116 mg/dL (ref 0.0–149.0)
VLDL: 23.2 mg/dL (ref 0.0–40.0)

## 2013-12-15 LAB — VITAMIN D 25 HYDROXY (VIT D DEFICIENCY, FRACTURES): VITD: 75.72 ng/mL

## 2013-12-21 ENCOUNTER — Encounter: Payer: Self-pay | Admitting: Family Medicine

## 2013-12-21 ENCOUNTER — Ambulatory Visit (INDEPENDENT_AMBULATORY_CARE_PROVIDER_SITE_OTHER): Payer: 59 | Admitting: Family Medicine

## 2013-12-21 VITALS — BP 128/60 | HR 66 | Temp 97.9°F | Ht 62.5 in | Wt 117.0 lb

## 2013-12-21 DIAGNOSIS — M949 Disorder of cartilage, unspecified: Secondary | ICD-10-CM

## 2013-12-21 DIAGNOSIS — F17200 Nicotine dependence, unspecified, uncomplicated: Secondary | ICD-10-CM

## 2013-12-21 DIAGNOSIS — M899 Disorder of bone, unspecified: Secondary | ICD-10-CM

## 2013-12-21 DIAGNOSIS — F411 Generalized anxiety disorder: Secondary | ICD-10-CM

## 2013-12-21 DIAGNOSIS — Z0000 Encounter for general adult medical examination without abnormal findings: Secondary | ICD-10-CM

## 2013-12-21 DIAGNOSIS — F419 Anxiety disorder, unspecified: Secondary | ICD-10-CM

## 2013-12-21 DIAGNOSIS — E559 Vitamin D deficiency, unspecified: Secondary | ICD-10-CM

## 2013-12-21 NOTE — Patient Instructions (Signed)
Stop at check out regarding bone density referral  Wean off prozac - take 1/2 pill daily for a week and then stop it  Continue calcium and vitamin D  Keep thinking about quitting smoking

## 2013-12-21 NOTE — Progress Notes (Signed)
Subjective:    Patient ID: Regina Ruiz, female    DOB: 03-Feb-1956, 58 y.o.   MRN: [id number]  HPI Here for health maintenance exam and to review chronic medical problems    She has lost 8 lb - bmi is 21  She thinks it is from prozac  No appetite lately - does not eat very much, and stays a little jittery  Doing well emotionally  It did not help her sleep  Takes xanax as needed  Wants to try going without anti dep right now   Flu vaccine  10/14 Mammogram 6/15  Self exam-no lumps   Pap -not going to gyn 7/13  No menses or bleeding at all  No new sexual partners   Td 12/07  colonosc 6/12 - polyp - hyperplastic 10 y recall   Osteopenia  dexa 4/13  Due for one  Still smokes  Had a wrist fx in feb  Was on fosamax and was intolerant  Is taking ca and D D level is 75   Has thought about quitting smoking  Stress level is not too bad  She does care to smoke as well as she used to   Results for orders placed in visit on 12/15/13  CBC WITH DIFFERENTIAL      Result Value Ref Range   WBC 9.2  4.0 - 10.5 K/uL   RBC 4.77  3.87 - 5.11 Mil/uL   Hemoglobin 14.9  12.0 - 15.0 g/dL   HCT 43.6  36.0 - 46.0 %   MCV 91.3  78.0 - 100.0 fl   MCHC 34.2  30.0 - 36.0 g/dL   RDW 12.6  11.5 - 15.5 %   Platelets 308.0  150.0 - 400.0 K/uL   Neutrophils Relative % 78.8 (*) 43.0 - 77.0 %   Lymphocytes Relative 13.7  12.0 - 46.0 %   Monocytes Relative 6.7  3.0 - 12.0 %   Eosinophils Relative 0.6  0.0 - 5.0 %   Basophils Relative 0.2  0.0 - 3.0 %   Neutro Abs 7.3  1.4 - 7.7 K/uL   Lymphs Abs 1.3  0.7 - 4.0 K/uL   Monocytes Absolute 0.6  0.1 - 1.0 K/uL   Eosinophils Absolute 0.1  0.0 - 0.7 K/uL   Basophils Absolute 0.0  0.0 - 0.1 K/uL  COMPREHENSIVE METABOLIC PANEL      Result Value Ref Range   Sodium 135  135 - 145 mEq/L   Potassium 4.0  3.5 - 5.1 mEq/L   Chloride 101  96 - 112 mEq/L   CO2 28  19 - 32 mEq/L   Glucose, Bld 94  70 - 99 mg/dL   BUN 10  6 - 23 mg/dL   Creatinine, Ser  0.9  0.4 - 1.2 mg/dL   Total Bilirubin 0.7  0.2 - 1.2 mg/dL   Alkaline Phosphatase 108  39 - 117 U/L   AST 18  0 - 37 U/L   ALT 12  0 - 35 U/L   Total Protein 6.9  6.0 - 8.3 g/dL   Albumin 4.0  3.5 - 5.2 g/dL   Calcium 9.0  8.4 - 10.5 mg/dL   GFR 66.99  >60.00 mL/min  LIPID PANEL      Result Value Ref Range   Cholesterol 151  0 - 200 mg/dL   Triglycerides 116.0  0.0 - 149.0 mg/dL   HDL 46.70  >39.00 mg/dL   VLDL 23.2  0.0 - 40.0 mg/dL   LDL  Cholesterol 81  0 - 99 mg/dL   Total CHOL/HDL Ratio 3     NonHDL 104.30    TSH      Result Value Ref Range   TSH 0.37  0.35 - 4.50 uIU/mL  VITAMIN D 25 HYDROXY      Result Value Ref Range   VITD 75.72      Overall stable   Sees endo for thyroid   Patient Active Problem List   Diagnosis Date Noted  . Swelling of joint of right knee 07/12/2013  . Nocturnal leg cramps 07/12/2013  . Unspecified gastritis and gastroduodenitis without mention of hemorrhage 10/04/2012  . Routine gynecological examination 12/16/2011  . Sleep disorder 03/21/2011  . Hypothyroid 02/19/2011  . Routine general medical examination at a health care facility 10/31/2010  . Colitis 09/17/2010  . Trochanteric bursitis of left hip 09/09/2010  . Sciatica of left side 09/09/2010  . UNSPECIFIED VITAMIN D DEFICIENCY 12/06/2009  . ALLERGIC CONJUNCTIVITIS 10/23/2009  . PULMONARY NODULE 08/30/2008  . CIGARETTE SMOKER 08/25/2008  . Anxiety 09/01/2006  . C O P D 09/01/2006  . OSTEOPENIA 04/16/2006   Past Medical History  Diagnosis Date  . Anxiety   . COPD (chronic obstructive pulmonary disease)   . Depression   . Thyroid disease   . Osteopenia   . Colitis    Past Surgical History  Procedure Laterality Date  . Dilation and curettage of uterus    . Endometrial biopsy  6/12    Dr Phineas Real- negative  . Hysteroscopy  6.7.12    [facility]: ENDOMETRIAL POLYP  . Endomet polyp  6/12    removed    History  Substance Use Topics  . Smoking status: Current Every Day Smoker  -- 0.50 packs/day    Types: Cigarettes  . Smokeless tobacco: Never Used  . Alcohol Use: Yes     Comment: rare   Family History  Problem Relation Age of Onset  . Cancer Father     lung cancer and brain cancer  . Breast cancer Sister   . Stroke Maternal Grandfather   . Stroke Paternal Grandfather    Allergies  Allergen Reactions  . Butalbital-Aspirin-Caffeine     REACTION: unspecified  . Fosamax [Alendronate Sodium]     GI  . Meperidine Hcl     REACTION: unspecified  . Zolpidem Tartrate    Current Outpatient Prescriptions on File Prior to Visit  Medication Sig Dispense Refill  . ALPRAZolam (XANAX) 0.25 MG tablet TAKE 1 TABLET BY MOUTH 2 TIMES DAILY AS NEEDED  60 tablet  0  . Cholecalciferol (VITAMIN D) 400 UNITS capsule Take 400 Units by mouth daily.        [name] FLUoxetine (PROZAC) 20 MG tablet Take 1 tablet (20 mg total) by mouth daily.  30 tablet  5  . levothyroxine (SYNTHROID, LEVOTHROID) 88 MCG tablet Take 88 mcg by mouth daily.        . Multiple Vitamin (STRESS B PO) Take 2 tablets by mouth 2 (two) times daily.      . SUMAtriptan (IMITREX) 100 MG tablet Take 1 pill orally for migraine - if necessary may repeat once in 2 hours  10 tablet  3   No current facility-administered medications on file prior to visit.    Review of Systems Review of Systems  Constitutional: Negative for fever, fatigue and unexpected weight change. pos for loss of appetite  Eyes: Negative for pain and visual disturbance.  Respiratory: Negative for cough and shortness of breath.  Cardiovascular: Negative for cp or palpitations    Gastrointestinal: Negative for nausea, diarrhea and constipation.  Genitourinary: Negative for urgency and frequency.  Skin: Negative for pallor or rash   Neurological: Negative for weakness, light-headedness, numbness and headaches.  Hematological: Negative for adenopathy. Does not bruise/bleed easily.  Psychiatric/Behavioral: Negative for dysphoric mood. The patient is  not nervous/anxious.         Objective:   Physical Exam  Constitutional: She appears well-developed and well-nourished. No distress.  Slim and well app  HENT:  Head: Normocephalic and atraumatic.  Right Ear: External ear normal.  Left Ear: External ear normal.  Nose: Nose normal.  Mouth/Throat: Oropharynx is clear and moist.  Eyes: Conjunctivae and EOM are normal. Pupils are equal, round, and reactive to light. Right eye exhibits no discharge. Left eye exhibits no discharge. No scleral icterus.  Neck: Normal range of motion. Neck supple. No JVD present. No thyromegaly present.  Cardiovascular: Normal rate, regular rhythm, normal heart sounds and intact distal pulses.  Exam reveals no gallop.   Pulmonary/Chest: Effort normal and breath sounds normal. No respiratory distress. She has no wheezes. She has no rales.  Diffusely distant bs   Abdominal: Soft. Bowel sounds are normal. She exhibits no distension and no mass. There is no tenderness.  Genitourinary: No breast swelling, tenderness, discharge or bleeding.  Breast exam: No mass, nodules, thickening, tenderness, bulging, retraction, inflamation, nipple discharge or skin changes noted.  No axillary or clavicular LA.      Musculoskeletal: She exhibits no edema and no tenderness.  Lymphadenopathy:    She has no cervical adenopathy.  Neurological: She is alert. She has normal reflexes. No cranial nerve deficit. She exhibits normal muscle tone. Coordination normal.  Skin: Skin is warm and dry. No rash noted. No erythema. No pallor.  Psychiatric: She has a normal mood and affect.          Assessment & Plan:   Problem List Items Addressed This Visit     Musculoskeletal and Integument   OSTEOPENIA - Primary     In smoker Hx of fracture Schedule 2 y dexa Disc need for calcium/ vitamin D/ wt bearing exercise and bone density test every 2 y to monitor Disc safety/ fracture risk in detail      Relevant Orders      DG Bone Density       Other   Anxiety     Doing much better  Wants to wean off of prozac due to her loss of appetite with it and does not want to replace it  Will update if symptoms worsen inst to cut prozac to 10 mg for a week and then stop    UNSPECIFIED VITAMIN D DEFICIENCY     Vitamin D level is therapeutic with current supplementation Disc importance of this to bone and overall health     CIGARETTE SMOKER     41 pack years approx Disc in detail risks of smoking and possible outcomes including copd, vascular/ heart disease, cancer , respiratory and sinus infections  Pt voices understanding She is not ready to quit yet    Routine general medical examination at a health care facility     Reviewed health habits including diet and exercise and skin cancer prevention Reviewed appropriate screening tests for age  Also reviewed health mt list, fam hx and immunization status , as well as social and family history    See HPI Labs reviewed

## 2013-12-21 NOTE — Progress Notes (Signed)
Pre visit review using our clinic review tool, if applicable. No additional management support is needed unless otherwise documented below in the visit note. 

## 2013-12-21 NOTE — Assessment & Plan Note (Signed)
41 pack years approx Disc in detail risks of smoking and possible outcomes including copd, vascular/ heart disease, cancer , respiratory and sinus infections  Pt voices understanding She is not ready to quit yet

## 2013-12-22 NOTE — Assessment & Plan Note (Signed)
Doing much better  Wants to wean off of prozac due to her loss of appetite with it and does not want to replace it  Will update if symptoms worsen inst to cut prozac to 10 mg for a week and then stop

## 2013-12-22 NOTE — Assessment & Plan Note (Signed)
In smoker Hx of fracture Schedule 2 y dexa Disc need for calcium/ vitamin D/ wt bearing exercise and bone density test every 2 y to monitor Disc safety/ fracture risk in detail

## 2013-12-22 NOTE — Assessment & Plan Note (Signed)
Vitamin D level is therapeutic with current supplementation Disc importance of this to bone and overall health  

## 2013-12-22 NOTE — Assessment & Plan Note (Signed)
Reviewed health habits including diet and exercise and skin cancer prevention Reviewed appropriate screening tests for age  Also reviewed health mt list, fam hx and immunization status , as well as social and family history   See HPI Labs reviewed  

## 2014-01-04 ENCOUNTER — Other Ambulatory Visit: Payer: Self-pay | Admitting: Family Medicine

## 2014-01-04 NOTE — Telephone Encounter (Signed)
Rx called in as prescribed 

## 2014-01-04 NOTE — Telephone Encounter (Signed)
Electronic refill request, please advise  

## 2014-01-04 NOTE — Telephone Encounter (Signed)
Px written for call in   

## 2014-01-17 LAB — HM DEXA SCAN

## 2014-01-20 ENCOUNTER — Encounter: Payer: Self-pay | Admitting: Family Medicine

## 2014-01-24 ENCOUNTER — Encounter: Payer: Self-pay | Admitting: Family Medicine

## 2014-01-24 ENCOUNTER — Encounter: Payer: Self-pay | Admitting: *Deleted

## 2014-02-03 ENCOUNTER — Other Ambulatory Visit: Payer: Self-pay | Admitting: Family Medicine

## 2014-02-03 NOTE — Telephone Encounter (Signed)
Electronic refill request, please advise  

## 2014-02-03 NOTE — Telephone Encounter (Signed)
Px written for call in   

## 2014-02-03 NOTE — Telephone Encounter (Signed)
Rx called in as prescribed 

## 2014-02-24 ENCOUNTER — Ambulatory Visit (INDEPENDENT_AMBULATORY_CARE_PROVIDER_SITE_OTHER): Payer: 59 | Admitting: Family Medicine

## 2014-02-24 ENCOUNTER — Encounter: Payer: Self-pay | Admitting: Family Medicine

## 2014-02-24 VITALS — BP 122/60 | HR 72 | Temp 97.9°F | Ht 62.5 in | Wt 116.5 lb

## 2014-02-24 DIAGNOSIS — J029 Acute pharyngitis, unspecified: Secondary | ICD-10-CM

## 2014-02-24 DIAGNOSIS — B9789 Other viral agents as the cause of diseases classified elsewhere: Principal | ICD-10-CM

## 2014-02-24 DIAGNOSIS — J069 Acute upper respiratory infection, unspecified: Secondary | ICD-10-CM

## 2014-02-24 LAB — POCT RAPID STREP A (OFFICE): Rapid Strep A Screen: NEGATIVE

## 2014-02-24 MED ORDER — BENZONATATE 200 MG PO CAPS: 200.0000 mg | ORAL_CAPSULE | Freq: Three times a day (TID) | ORAL | Status: DC | PRN

## 2014-02-24 NOTE — Assessment & Plan Note (Signed)
Reassuring exam Disc symptomatic care - see instructions on AVS - tessalon refilled/ disc nasal congestion and use of saline  Update if not starting to improve in a week or if worsening   Enc to quit smoking-she has a plan

## 2014-02-24 NOTE — Progress Notes (Signed)
Subjective:    Patient ID: Regina Ruiz, female    DOB: [birth year], 57 y.o.   MRN: [id number]  HPI Here for uri symptoms  St and nasal cong and cough  99 fever yesterday- slept all day / had some chills Tessalon and ibuprofen  Some allergy med  Cough is prod- yellow in color  Perhaps a little wheeze/lots of post nasal drip  No sinus pain   Plans to quit smoking on Oct 1st the nicotine losenges     Results for orders placed in visit on 02/24/14  POCT RAPID STREP A (OFFICE)      Result Value Ref Range   Rapid Strep A Screen Negative  Negative     Patient Active Problem List   Diagnosis Date Noted  . Viral URI with cough 02/24/2014  . Nocturnal leg cramps 07/12/2013  . Unspecified gastritis and gastroduodenitis without mention of hemorrhage 10/04/2012  . Routine gynecological examination 12/16/2011  . Sleep disorder 03/21/2011  . Hypothyroid 02/19/2011  . Routine general medical examination at a health care facility 10/31/2010  . Colitis 09/17/2010  . Trochanteric bursitis of left hip 09/09/2010  . Sciatica of left side 09/09/2010  . UNSPECIFIED VITAMIN D DEFICIENCY 12/06/2009  . ALLERGIC CONJUNCTIVITIS 10/23/2009  . PULMONARY NODULE 08/30/2008  . CIGARETTE SMOKER 08/25/2008  . Anxiety 09/01/2006  . C O P D 09/01/2006  . OSTEOPENIA 04/16/2006   Past Medical History  Diagnosis Date  . Anxiety   . COPD (chronic obstructive pulmonary disease)   . Depression   . Thyroid disease   . Osteopenia   . Colitis    Past Surgical History  Procedure Laterality Date  . Dilation and curettage of uterus    . Endometrial biopsy  6/12    Dr Phineas Real- negative  . Hysteroscopy  6.7.12    [facility]: ENDOMETRIAL POLYP  . Endomet polyp  6/12    removed    History  Substance Use Topics  . Smoking status: Current Every Day Smoker -- 0.50 packs/day    Types: Cigarettes  . Smokeless tobacco: Never Used  . Alcohol Use: Yes     Comment: rare   Family History  Problem Relation  Age of Onset  . Cancer Father     lung cancer and brain cancer  . Breast cancer Sister   . Stroke Maternal Grandfather   . Stroke Paternal Grandfather    Allergies  Allergen Reactions  . Butalbital-Aspirin-Caffeine     REACTION: unspecified  . Fosamax [Alendronate Sodium]     GI  . Meperidine Hcl     REACTION: unspecified  . Zolpidem Tartrate    Current Outpatient Prescriptions on File Prior to Visit  Medication Sig Dispense Refill  . ALPRAZolam (XANAX) 0.25 MG tablet TAKE 1 TABLET BY MOUTH TWICE DAILY AS NEEDED  60 tablet  3  . Cholecalciferol (VITAMIN D) 400 UNITS capsule Take 400 Units by mouth daily.        [name] levothyroxine (SYNTHROID, LEVOTHROID) 88 MCG tablet Take 88 mcg by mouth daily.        . Multiple Vitamin (STRESS B PO) Take 2 tablets by mouth 2 (two) times daily.      . SUMAtriptan (IMITREX) 100 MG tablet Take 1 pill orally for migraine - if necessary may repeat once in 2 hours  10 tablet  3   No current facility-administered medications on file prior to visit.    Review of Systems Review of Systems  Constitutional: Negative for , appetite  change, and unexpected weight change.  ENT pos for congestion / st/ drip Eyes: Negative for pain and visual disturbance.  Respiratory: Negative for shortness of breath.   Cardiovascular: Negative for cp or palpitations    Gastrointestinal: Negative for nausea, diarrhea and constipation.  Genitourinary: Negative for urgency and frequency.  Skin: Negative for pallor or rash   Neurological: Negative for weakness, light-headedness, numbness and headaches.  Hematological: Negative for adenopathy. Does not bruise/bleed easily.  Psychiatric/Behavioral: Negative for dysphoric mood. The patient is not nervous/anxious.         Objective:   Physical Exam  Constitutional: She appears well-developed and well-nourished. No distress.  HENT:  Head: Normocephalic and atraumatic.  Right Ear: External ear normal.  Mouth/Throat: Oropharynx  is clear and moist. No oropharyngeal exudate.  Nares are injected and congested   No sinus tenderness Throat clear with post nasal drip  Eyes: Conjunctivae and EOM are normal. Pupils are equal, round, and reactive to light. Right eye exhibits no discharge. Left eye exhibits no discharge.  Neck: Normal range of motion. Neck supple.  Cardiovascular: Normal rate, regular rhythm and normal heart sounds.   Pulmonary/Chest: Effort normal and breath sounds normal. No respiratory distress. She has no wheezes. She has no rales. She exhibits no tenderness.  Diffusely distant bs  No rales or wheeze   Lymphadenopathy:    She has no cervical adenopathy.  Neurological: She is alert.  Skin: Skin is warm and dry. No rash noted.  Psychiatric: She has a normal mood and affect.          Assessment & Plan:   Problem List Items Addressed This Visit     Respiratory   Viral URI with cough     Reassuring exam Disc symptomatic care - see instructions on AVS - tessalon refilled/ disc nasal congestion and use of saline  Update if not starting to improve in a week or if worsening   Enc to quit smoking-she has a plan      Other Visit Diagnoses   Sore throat    -  Primary    Relevant Orders       Rapid Strep A (Completed)

## 2014-02-24 NOTE — Progress Notes (Signed)
Pre visit review using our clinic review tool, if applicable. No additional management support is needed unless otherwise documented below in the visit note. 

## 2014-02-24 NOTE — Patient Instructions (Signed)
I think you have a viral uri/cold Drink lots of fluids Use robitussin DM or mucinex DM for cough  Also tessalon  Zyrtec for runny nose if you need it  Also nasal saline spray  Update if not starting to improve in a week or if worsening

## 2014-04-29 ENCOUNTER — Other Ambulatory Visit: Payer: Self-pay | Admitting: Family Medicine

## 2014-05-01 NOTE — Telephone Encounter (Signed)
done

## 2014-05-01 NOTE — Telephone Encounter (Signed)
Please refill for a year  

## 2014-05-01 NOTE — Telephone Encounter (Signed)
Electronic refill request, please advise  

## 2014-06-03 ENCOUNTER — Other Ambulatory Visit: Payer: Self-pay | Admitting: Family Medicine

## 2014-06-05 NOTE — Telephone Encounter (Signed)
Rx called in as prescribed 

## 2014-06-05 NOTE — Telephone Encounter (Signed)
Electronic refill request, please advise  

## 2014-06-05 NOTE — Telephone Encounter (Signed)
Px written for call in   

## 2014-10-04 ENCOUNTER — Other Ambulatory Visit: Payer: Self-pay | Admitting: Family Medicine

## 2014-10-04 NOTE — Telephone Encounter (Signed)
Px written for call in   

## 2014-10-04 NOTE — Telephone Encounter (Signed)
Electronic refill request, last refill on 06/05/14, #60 with 3 additional refills

## 2014-10-04 NOTE — Telephone Encounter (Signed)
Rx called in as prescribed 

## 2015-01-25 ENCOUNTER — Other Ambulatory Visit: Payer: Self-pay | Admitting: Family Medicine

## 2015-01-27 ENCOUNTER — Other Ambulatory Visit: Payer: Self-pay | Admitting: Family Medicine

## 2015-01-29 NOTE — Telephone Encounter (Signed)
If it's not due until the 19th - it is 4 days early - ask her if she needs it today for any reason- if not can fill in a few days

## 2015-01-29 NOTE — Telephone Encounter (Signed)
Pt said pharmacy already advise her it's to early (nothing going on that she needs it early) pt said she will request Rx on 10/03/14, Rx declined

## 2015-01-29 NOTE — Telephone Encounter (Signed)
Electronic refill request, last refilled on 10/04/14 #60 with 3 additional refills (? If we can fill it yet, not due until 02/02/15)

## 2015-02-02 ENCOUNTER — Other Ambulatory Visit: Payer: Self-pay | Admitting: Family Medicine

## 2015-02-02 NOTE — Telephone Encounter (Signed)
Please schedule her next physical  Px written for call in

## 2015-02-02 NOTE — Telephone Encounter (Signed)
Called pt to schedule appt and she advise me she is out of work and has no insurance so she can't afford to come in for a CPE, appt not scheduled and Rx not called into pharmacy until I get Dr. Marliss Coots response, please advise

## 2015-02-02 NOTE — Telephone Encounter (Signed)
Last CPE was 12/21/13 and no future appt, last refilled on 10/04/14 #60 with 3 additional refills, please advise

## 2015-02-05 ENCOUNTER — Ambulatory Visit (INDEPENDENT_AMBULATORY_CARE_PROVIDER_SITE_OTHER): Payer: Self-pay | Admitting: Family Medicine

## 2015-02-05 ENCOUNTER — Encounter: Payer: Self-pay | Admitting: Family Medicine

## 2015-02-05 VITALS — BP 122/70 | HR 60 | Temp 97.7°F | Ht 62.5 in | Wt 135.0 lb

## 2015-02-05 DIAGNOSIS — M949 Disorder of cartilage, unspecified: Secondary | ICD-10-CM

## 2015-02-05 DIAGNOSIS — M899 Disorder of bone, unspecified: Secondary | ICD-10-CM

## 2015-02-05 DIAGNOSIS — F419 Anxiety disorder, unspecified: Secondary | ICD-10-CM

## 2015-02-05 DIAGNOSIS — Z720 Tobacco use: Secondary | ICD-10-CM

## 2015-02-05 DIAGNOSIS — F17200 Nicotine dependence, unspecified, uncomplicated: Secondary | ICD-10-CM

## 2015-02-05 NOTE — Progress Notes (Signed)
Pre visit review using our clinic review tool, if applicable. No additional management support is needed unless otherwise documented below in the visit note. 

## 2015-02-05 NOTE — Assessment & Plan Note (Signed)
Not due for dexa yet  Counseled on smoking cessaion   Disc need for calcium/ vitamin D/ wt bearing exercise and bone density test every 2 y to monitor Disc safety/ fracture risk in detail

## 2015-02-05 NOTE — Telephone Encounter (Signed)
Pt scheduled a med refill appt for today, Rx called in as prescribed

## 2015-02-05 NOTE — Patient Instructions (Addendum)
Good luck with your job search  Get a flu shot in the fall - try the health department  Keep your endocrinology follow ups  Continue calcium and vitamin D  Keep thinking about quitting smoking   Stay on prozac if it helps  Xanax only if needed    Get your mammogram when you can afford it/ get insurance

## 2015-02-05 NOTE — Progress Notes (Signed)
Subjective:    Patient ID: Regina Ruiz, female    DOB: [birth year], 59 y.o.   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 [place]  Has been aggressively job searching   (bored and staying busy)    [employer] is up 19 lb  bmi is 24  Anxiety/mood On xanax and prozac  Takes prozac on and off - was cutting her appetite and she takes 1/2 dose -has gained weight back  More anxious due to loosing job - has taken more xanax lately (56 per month)     Smoking status -went up a bit - .5ppd and really wants to quit  Cutting down again  Hard being out of work  Will set a quit date once she is working again   Osteopenia  Had dexa 8/15 stable  Ca and D Last D level in 70s No falls or fractures in the past year    Hypothyroid (post ablative) sees endocrinology Goes every February    Patient Active Problem List   Diagnosis Date Noted  . Viral URI with cough 02/24/2014  . Nocturnal leg cramps 07/12/2013  . Unspecified gastritis and gastroduodenitis without mention of hemorrhage 10/04/2012  . Routine gynecological examination 12/16/2011  . Sleep disorder 03/21/2011  . Hypothyroid 02/19/2011  . Routine general medical examination at a health care facility 10/31/2010  . Colitis 09/17/2010  . Trochanteric bursitis of left hip 09/09/2010  . Sciatica of left side 09/09/2010  . UNSPECIFIED VITAMIN D DEFICIENCY 12/06/2009  . ALLERGIC CONJUNCTIVITIS 10/23/2009  . PULMONARY NODULE 08/30/2008  . CIGARETTE SMOKER 08/25/2008  . Anxiety 09/01/2006  . C O P D 09/01/2006  . OSTEOPENIA 04/16/2006   Past Medical History  Diagnosis Date  . Anxiety   . COPD (chronic obstructive pulmonary disease)   . Depression   . Thyroid disease   . Osteopenia   . Colitis      Past Surgical History  Procedure Laterality Date  . Dilation and curettage of uterus    . Endometrial biopsy  6/12    Dr Phineas Real- negative  . Hysteroscopy  6.7.12    [facility]: ENDOMETRIAL POLYP  . Endomet polyp  6/12    removed    Social History  Substance Use Topics  . Smoking status: Current Every Day Smoker -- 0.50 packs/day    Types: Cigarettes  . Smokeless tobacco: Never Used  . Alcohol Use: 0.0 oz/week    0 Standard drinks or equivalent per week     Comment: rare   Family History  Problem Relation Age of Onset  . Cancer Father     lung cancer and brain cancer  . Breast cancer Sister   . Stroke Maternal Grandfather   . Stroke Paternal Grandfather    Allergies  Allergen Reactions  . Butalbital-Aspirin-Caffeine     REACTION: unspecified  . Fosamax [Alendronate Sodium]     GI  . Meperidine Hcl     REACTION: unspecified  . Zolpidem Tartrate    Current Outpatient Prescriptions on File Prior to Visit  Medication Sig Dispense Refill  . ALPRAZolam (XANAX) 0.25 MG tablet TAKE 1 TABLET BY MOUTH TWICE A DAY AS NEEDED 60 tablet 3  . benzonatate (  TESSALON) 200 MG capsule Take 1 capsule (200 mg total) by mouth 3 (three) times daily as needed for cough (swallow whole). 30 capsule 1  . Cholecalciferol (VITAMIN D) 400 UNITS capsule Take 400 Units by mouth daily.      [name] FLUoxetine (PROZAC) 20 MG tablet TAKE 1 TABLET BY MOUTH DAILY (Patient taking differently: TAKE 1 TABLET BY MOUTH DAILY ASS NEEDED) 30 tablet 11  . levothyroxine (SYNTHROID, LEVOTHROID) 88 MCG tablet Take 88 mcg by mouth daily.      . Multiple Vitamin (STRESS B PO) Take 2 tablets by mouth 2 (two) times daily.    . SUMAtriptan (IMITREX) 100 MG tablet Take 1 pill orally for migraine - if necessary may repeat once in 2 hours 10 tablet 3   No current facility-administered medications on file prior to visit.      Review of Systems Review of Systems  Constitutional: Negative for fever, appetite change, fatigue and  unexpected weight change. (pos for improved appetite) Eyes: Negative for pain and visual disturbance.  Respiratory: Negative for cough and shortness of breath.   Cardiovascular: Negative for cp or palpitations    Gastrointestinal: Negative for nausea, diarrhea and constipation.  Genitourinary: Negative for urgency and frequency.  Skin: Negative for pallor or rash   Neurological: Negative for weakness, light-headedness, numbness and headaches. pos for occasional tremor when anxious  Hematological: Negative for adenopathy. Does not bruise/bleed easily.  Psychiatric/Behavioral: Negative for dysphoric mood. Pos for anxiety that is fairly controlled         Objective:   Physical Exam  Constitutional: She appears well-developed and well-nourished. No distress.  Well appearing   HENT:  Head: Normocephalic and atraumatic.  Eyes: Conjunctivae and EOM are normal. Pupils are equal, round, and reactive to light. No scleral icterus.  Neck: Normal range of motion. Neck supple.  Cardiovascular: Normal rate and regular rhythm.   Pulmonary/Chest: Effort normal and breath sounds normal. No respiratory distress. She has no wheezes. She has no rales.  Diffusely distant bs   Musculoskeletal: She exhibits no edema.  Lymphadenopathy:    She has no cervical adenopathy.  Neurological: She is alert. She has normal reflexes. She displays tremor. No cranial nerve deficit. She exhibits normal muscle tone. Coordination normal.  Mild hand tremor   Skin: Skin is warm and dry. No rash noted. No erythema. No pallor.  tanned  Psychiatric: Her speech is normal and behavior is normal. Thought content normal. Her mood appears anxious. Her affect is not blunt and not inappropriate. Thought content is not paranoid. She expresses no homicidal and no suicidal ideation.  Mildly anxious  Good insight  Pleasant and talkative           Assessment & Plan:   Problem List Items Addressed This Visit    Anxiety - Primary      Doing fair given that she lost her job Taking prozac 1/2 pill - and that does not interfere with appetite as much (she did gain weight back)  Reviewed stressors/ coping techniques/symptoms/ support sources/ tx options and side effects in detail today Enc her to take xanax only when needed - rev habit and sedation risk  Refilled this  Will return for health mt when she gets insurance       CIGARETTE SMOKER    Disc in detail risks of smoking and possible outcomes including copd, vascular/ heart disease, cancer , respiratory and sinus infections  Pt voices understanding Pt picked back up with smoking-now cutting back again  States it is too hard to quit during joblessness - but hopes to quit once she is working again       Disorder of bone and cartilage    Not due for dexa yet  Counseled on smoking cessaion   Disc need for calcium/ vitamin D/ wt bearing exercise and bone density test every 2 y to monitor Disc safety/ fracture risk in detail

## 2015-02-05 NOTE — Assessment & Plan Note (Signed)
Doing fair given that she lost her job Taking prozac 1/2 pill - and that does not interfere with appetite as much (she did gain weight back)  Reviewed stressors/ coping techniques/symptoms/ support sources/ tx options and side effects in detail today Enc her to take xanax only when needed - rev habit and sedation risk  Refilled this  Will return for health mt when she gets insurance

## 2015-02-05 NOTE — Assessment & Plan Note (Signed)
Disc in detail risks of smoking and possible outcomes including copd, vascular/ heart disease, cancer , respiratory and sinus infections  Pt voices understanding Pt picked back up with smoking-now cutting back again  States it is too hard to quit during joblessness - but hopes to quit once she is working again

## 2015-06-04 ENCOUNTER — Other Ambulatory Visit: Payer: Self-pay | Admitting: Family Medicine

## 2015-06-04 NOTE — Telephone Encounter (Signed)
Pt had med refill appt on 02/05/15, last refilled on 02/02/15 #60 with 3 additional refills, please advise

## 2015-06-04 NOTE — Telephone Encounter (Signed)
Px written for call in   

## 2015-06-04 NOTE — Telephone Encounter (Signed)
Rx called in as prescribed 

## 2015-09-26 ENCOUNTER — Other Ambulatory Visit: Payer: Self-pay | Admitting: Family Medicine

## 2015-09-27 NOTE — Telephone Encounter (Signed)
Pt has CPE scheduled on 02/26/16, last refilled on 06/04/15 #60 with 3 additional refills

## 2015-09-27 NOTE — Telephone Encounter (Signed)
Px written for call in   

## 2015-09-28 ENCOUNTER — Other Ambulatory Visit: Payer: Self-pay | Admitting: Family Medicine

## 2015-10-01 NOTE — Telephone Encounter (Signed)
Pt left v/m requesting status of alprazolam refill; left v/m per DPR refill was called to [facility].Medication phoned to [facility] pharmacy as instructed.

## 2015-12-28 ENCOUNTER — Other Ambulatory Visit: Payer: Self-pay | Admitting: Family Medicine

## 2015-12-28 NOTE — Telephone Encounter (Signed)
Px written for call in   

## 2015-12-28 NOTE — Telephone Encounter (Signed)
Rx called in as prescribed 

## 2015-12-28 NOTE — Telephone Encounter (Signed)
Last filled on 09/27/15 #60 with 2 additional refills, and pt has CPE scheduled on 02/26/16

## 2016-02-08 LAB — HM DEXA SCAN

## 2016-02-08 LAB — HM MAMMOGRAPHY

## 2016-02-13 ENCOUNTER — Encounter: Payer: Self-pay | Admitting: Family Medicine

## 2016-02-14 ENCOUNTER — Encounter: Payer: Self-pay | Admitting: Family Medicine

## 2016-02-14 ENCOUNTER — Encounter: Payer: Self-pay | Admitting: *Deleted

## 2016-02-18 ENCOUNTER — Telehealth: Payer: Self-pay | Admitting: Family Medicine

## 2016-02-18 DIAGNOSIS — Z0000 Encounter for general adult medical examination without abnormal findings: Secondary | ICD-10-CM

## 2016-02-18 DIAGNOSIS — E559 Vitamin D deficiency, unspecified: Secondary | ICD-10-CM

## 2016-02-18 NOTE — Telephone Encounter (Signed)
-----   Message from Ellamae Sia sent at 02/12/2016  4:07 PM EDT ----- Regarding: Lab orders for  Wednesday, 9.6.17 Patient is scheduled for CPX labs, please order future labs, Thanks , Karna Christmas

## 2016-02-20 ENCOUNTER — Other Ambulatory Visit (INDEPENDENT_AMBULATORY_CARE_PROVIDER_SITE_OTHER): Payer: 59

## 2016-02-20 DIAGNOSIS — E559 Vitamin D deficiency, unspecified: Secondary | ICD-10-CM

## 2016-02-20 DIAGNOSIS — Z0000 Encounter for general adult medical examination without abnormal findings: Secondary | ICD-10-CM | POA: Diagnosis not present

## 2016-02-20 LAB — COMPREHENSIVE METABOLIC PANEL
ALK PHOS: 116 U/L (ref 39–117)
ALT: 14 U/L (ref 0–35)
AST: 15 U/L (ref 0–37)
Albumin: 4.2 g/dL (ref 3.5–5.2)
BILIRUBIN TOTAL: 0.4 mg/dL (ref 0.2–1.2)
BUN: 17 mg/dL (ref 6–23)
CALCIUM: 9.1 mg/dL (ref 8.4–10.5)
CO2: 29 meq/L (ref 19–32)
Chloride: 103 mEq/L (ref 96–112)
Creatinine, Ser: 0.87 mg/dL (ref 0.40–1.20)
GFR: 70.9 mL/min (ref >60.00)
Glucose, Bld: 94 mg/dL (ref 70–99)
POTASSIUM: 4.3 meq/L (ref 3.5–5.1)
Sodium: 139 mEq/L (ref 135–145)
TOTAL PROTEIN: 7.2 g/dL (ref 6.0–8.3)

## 2016-02-20 LAB — CBC WITH DIFFERENTIAL/PLATELET
BASOS PCT: 0.4 % (ref 0.0–3.0)
Basophils Absolute: 0 10*3/uL (ref 0.0–0.1)
EOS ABS: 0.1 10*3/uL (ref 0.0–0.7)
Eosinophils Relative: 0.8 % (ref 0.0–5.0)
HEMATOCRIT: 40.6 % (ref 36.0–46.0)
HEMOGLOBIN: 14.1 g/dL (ref 12.0–15.0)
LYMPHS PCT: 15.8 % (ref 12.0–46.0)
Lymphs Abs: 1.6 10*3/uL (ref 0.7–4.0)
MCHC: 34.6 g/dL (ref 30.0–36.0)
MCV: 86.8 fl (ref 78.0–100.0)
MONOS PCT: 6.7 % (ref 3.0–12.0)
Monocytes Absolute: 0.7 10*3/uL (ref 0.1–1.0)
Neutro Abs: 8 10*3/uL — ABNORMAL HIGH (ref 1.4–7.7)
Neutrophils Relative %: 76.3 % (ref 43.0–77.0)
Platelets: 352 10*3/uL (ref 150.0–400.0)
RBC: 4.68 Mil/uL (ref 3.87–5.11)
RDW: 12.6 % (ref 11.5–15.5)
WBC: 10.4 10*3/uL (ref 4.0–10.5)

## 2016-02-20 LAB — LIPID PANEL
Cholesterol: 133 mg/dL (ref 0–200)
HDL: 48.6 mg/dL
LDL Cholesterol: 68 mg/dL (ref 0–99)
NonHDL: 84.49
Total CHOL/HDL Ratio: 3
Triglycerides: 81 mg/dL (ref 0.0–149.0)
VLDL: 16.2 mg/dL (ref 0.0–40.0)

## 2016-02-20 LAB — TSH: TSH: 1.48 u[IU]/mL (ref 0.35–4.50)

## 2016-02-20 LAB — VITAMIN D 25 HYDROXY (VIT D DEFICIENCY, FRACTURES): VITD: 53.66 ng/mL (ref 30.00–100.00)

## 2016-02-26 ENCOUNTER — Other Ambulatory Visit (HOSPITAL_COMMUNITY)
Admission: RE | Admit: 2016-02-26 | Discharge: 2016-02-26 | Disposition: A | Discharge status: Home / Self Care | Payer: 59 | Source: Ambulatory Visit | Attending: Family Medicine | Admitting: Family Medicine

## 2016-02-26 ENCOUNTER — Ambulatory Visit (INDEPENDENT_AMBULATORY_CARE_PROVIDER_SITE_OTHER): Payer: 59 | Admitting: Family Medicine

## 2016-02-26 ENCOUNTER — Encounter: Payer: Self-pay | Admitting: Family Medicine

## 2016-02-26 VITALS — BP 98/70 | HR 74 | Temp 97.4°F | Ht 62.5 in | Wt 139.0 lb

## 2016-02-26 DIAGNOSIS — M949 Disorder of cartilage, unspecified: Secondary | ICD-10-CM

## 2016-02-26 DIAGNOSIS — E559 Vitamin D deficiency, unspecified: Secondary | ICD-10-CM | POA: Diagnosis not present

## 2016-02-26 DIAGNOSIS — Z01419 Encounter for gynecological examination (general) (routine) without abnormal findings: Secondary | ICD-10-CM | POA: Diagnosis present

## 2016-02-26 DIAGNOSIS — Z1151 Encounter for screening for human papillomavirus (HPV): Secondary | ICD-10-CM | POA: Diagnosis present

## 2016-02-26 DIAGNOSIS — M899 Disorder of bone, unspecified: Secondary | ICD-10-CM

## 2016-02-26 DIAGNOSIS — F17200 Nicotine dependence, unspecified, uncomplicated: Secondary | ICD-10-CM

## 2016-02-26 DIAGNOSIS — J410 Simple chronic bronchitis: Secondary | ICD-10-CM | POA: Insufficient documentation

## 2016-02-26 DIAGNOSIS — Z0000 Encounter for general adult medical examination without abnormal findings: Secondary | ICD-10-CM

## 2016-02-26 DIAGNOSIS — Z23 Encounter for immunization: Secondary | ICD-10-CM

## 2016-02-26 DIAGNOSIS — E039 Hypothyroidism, unspecified: Secondary | ICD-10-CM

## 2016-02-26 LAB — HM PAP SMEAR: HM Pap smear: NORMAL

## 2016-02-26 MED ORDER — ALPRAZOLAM 0.25 MG PO TABS: 0.2500 mg | ORAL_TABLET | Freq: Two times a day (BID) | ORAL | 1 refills | Status: DC | PRN

## 2016-02-26 MED ORDER — BENZONATATE 200 MG PO CAPS: 200.0000 mg | ORAL_CAPSULE | Freq: Three times a day (TID) | ORAL | 5 refills | Status: DC | PRN

## 2016-02-26 NOTE — Progress Notes (Signed)
Pre visit review using our clinic review tool, if applicable. No additional management support is needed unless otherwise documented below in the visit note. 

## 2016-02-26 NOTE — Progress Notes (Signed)
Subjective:    Patient ID: Regina Ruiz, female    DOB: April 09, 1956, 60 y.o.   MRN: [id number]  HPI Here for health maintenance exam and to review chronic medical problems    Feeling good  Trying to take care of herself  An eventful year - got laid off after 8 years, started new job in Nov and she loves it  Then her new boss quit -now a little uneasy  Finished divorce finally/ house is out of her name    Needs refill of xanax - printed   Flu shot - got it today   Tetanus shot due in Dec- she will come back for that   Mammogram 8/17-negative Self breast exam - no lumps  Sister had breast cancer   Colonoscopy 6/12-had a polyp- hyperplastic/ 10 year f/u for that   Pap nl 7/13 Has not been to gyn office  No abn paps  No bleeding or problems  No new partners  Does not want STD screening     dexa 8/17-worsened osteopenia  D level is 53.6 Does not tolerate calcium - gets it from diet  Takes vit D daily  Smoker- risk factor  Last fx was 2 y ago-arm No falls    Has had HIV and hep C screening when she donates blood on a regular basis -normal  Wt Readings from Last 3 Encounters:  02/26/16 139 lb (63 kg)  02/05/15 135 lb (61.2 kg)  02/24/14 116 lb 8 oz (52.8 kg)  stable - wants to loose a bit  Eating healthy and gets some exercise when she is not working long hours  bmi is 25.02  Smoking status - is trying to cut back and quit - feels more ready  Down to 1/2 ppd  Goal is quit by Jan 1  She does want to do it w/o medication  Looked up evista and prolia -will check on cost   Hypothyroidism  Pt has no clinical changes No change in energy level/ hair or skin/ edema and no tremor Lab Results  Component Value Date   TSH 1.48 02/20/2016      Cholesterol Lab Results  Component Value Date   CHOL 133 02/20/2016   CHOL 151 12/15/2013   CHOL 151 12/13/2012   Lab Results  Component Value Date   HDL 48.60 02/20/2016   HDL 46.70 12/15/2013   HDL 52.00 12/13/2012    Lab Results  Component Value Date   LDLCALC 68 02/20/2016   LDLCALC 81 12/15/2013   LDLCALC 84 12/13/2012   Lab Results  Component Value Date   TRIG 81.0 02/20/2016   TRIG 116.0 12/15/2013   TRIG 75.0 12/13/2012   Lab Results  Component Value Date   CHOLHDL 3 02/20/2016   CHOLHDL 3 12/15/2013   CHOLHDL 3 12/13/2012   No results found for: LDLDIRECT   Very good report   Patient Active Problem List   Diagnosis Date Noted  . Smokers' cough ([place]) 02/26/2016  . Encounter for routine gynecological examination 02/26/2016  . Nocturnal leg cramps 07/12/2013  . Unspecified gastritis and gastroduodenitis without mention of hemorrhage 10/04/2012  . Routine gynecological examination 12/16/2011  . Sleep disorder 03/21/2011  . Hypothyroid 02/19/2011  . Routine general medical examination at a health care facility 10/31/2010  . Colitis 09/17/2010  . Trochanteric bursitis of left hip 09/09/2010  . Sciatica of left side 09/09/2010  . Vitamin D deficiency 12/06/2009  . ALLERGIC CONJUNCTIVITIS 10/23/2009  . PULMONARY NODULE 08/30/2008  .  CIGARETTE SMOKER 08/25/2008  . Anxiety 09/01/2006  . C O P D 09/01/2006  . Disorder of bone and cartilage 04/16/2006   Past Medical History:  Diagnosis Date  . Anxiety   . Colitis   . COPD (chronic obstructive pulmonary disease) ([place])   . Depression   . Osteopenia   . Thyroid disease    Past Surgical History:  Procedure Laterality Date  . DILATION AND CURETTAGE OF UTERUS    . endomet polyp  6/12   removed   . ENDOMETRIAL BIOPSY  6/12   Dr Phineas Real- negative  . HYSTEROSCOPY  6.7.12   [facility]: ENDOMETRIAL POLYP   Social History  Substance Use Topics  . Smoking status: Current Every Day Smoker    Packs/day: 0.50    Types: Cigarettes  . Smokeless tobacco: Never Used  . Alcohol use 0.0 oz/week     Comment: rare   Family History  Problem Relation Age of Onset  . Cancer Father     lung cancer and brain cancer  . Breast cancer Sister     . Stroke Maternal Grandfather   . Stroke Paternal Grandfather    Allergies  Allergen Reactions  . Butalbital-Aspirin-Caffeine     REACTION: unspecified  . Fosamax [Alendronate Sodium]     GI  . Meperidine Hcl     REACTION: unspecified  . Zolpidem Tartrate    Current Outpatient Prescriptions on File Prior to Visit  Medication Sig Dispense Refill  . Cholecalciferol (VITAMIN D) 400 UNITS capsule Take 400 Units by mouth daily.      [name] levothyroxine (SYNTHROID, LEVOTHROID) 88 MCG tablet Take 88 mcg by mouth daily.      . Multiple Vitamin (STRESS B PO) Take 2 tablets by mouth 2 (two) times daily.    . SUMAtriptan (IMITREX) 100 MG tablet Take 1 pill orally for migraine - if necessary may repeat once in 2 hours 10 tablet 3   No current facility-administered medications on file prior to visit.     Review of Systems Review of Systems  Constitutional: Negative for fever, appetite change, fatigue and unexpected weight change.  Eyes: Negative for pain and visual disturbance.  Respiratory: Negative for wheeze and shortness of breath.  pos for smokers cough  Cardiovascular: Negative for cp or palpitations    Gastrointestinal: Negative for nausea, diarrhea and constipation. occ she has gas pain in upper abdomen - often when bending over (spasm)  Genitourinary: Negative for urgency and frequency.  Skin: Negative for pallor or rash   Neurological: Negative for weakness, light-headedness, numbness and headaches.  Hematological: Negative for adenopathy. Does not bruise/bleed easily.  Psychiatric/Behavioral: Negative for dysphoric mood. The patient is not nervous/anxious.         Objective:   Physical Exam  Constitutional: She appears well-developed and well-nourished. No distress.  Well appearing   HENT:  Head: Normocephalic and atraumatic.  Right Ear: External ear normal.  Left Ear: External ear normal.  Mouth/Throat: Oropharynx is clear and moist.  Eyes: Conjunctivae and EOM are normal.  Pupils are equal, round, and reactive to light. No scleral icterus.  Neck: Normal range of motion. Neck supple. No JVD present. Carotid bruit is not present. No thyromegaly present.  Cardiovascular: Normal rate, regular rhythm, normal heart sounds and intact distal pulses.  Exam reveals no gallop.   Pulmonary/Chest: Effort normal and breath sounds normal. No respiratory distress. She has no wheezes. She exhibits no tenderness.  Diffusely distant bs   Abdominal: Soft. Bowel sounds are normal.  She exhibits no distension, no abdominal bruit and no mass. There is no tenderness.  Genitourinary: No breast swelling, tenderness, discharge or bleeding.  Genitourinary Comments: Breast exam: No mass, nodules, thickening, tenderness, bulging, retraction, inflamation, nipple discharge or skin changes noted.  No axillary or clavicular LA.      Musculoskeletal: Normal range of motion. She exhibits no edema or tenderness.  Lymphadenopathy:    She has no cervical adenopathy.  Neurological: She is alert. She has normal reflexes. No cranial nerve deficit. She exhibits normal muscle tone. Coordination normal.  Skin: Skin is warm and dry. No rash noted. No erythema. No pallor.  Some SKs and lentigines   Psychiatric: She has a normal mood and affect.          Assessment & Plan:   Problem List Items Addressed This Visit      Respiratory   Smokers' cough ([place])    Intermittent w/o sob Will plan cxr next time she is here Refilled tessalon  Plans to quit smoking Jan 1st or earlier         Endocrine   Hypothyroid    Hypothyroidism  Pt has no clinical changes No change in energy level/ hair or skin/ edema and no tremor Lab Results  Component Value Date   TSH 1.48 02/20/2016             Musculoskeletal and Integument   Disorder of bone and cartilage    Osteopenia is worsening  Took almost 5 y of fosamax and then developed side eff Want to avoid evista due to blood clot risk since she still  smokes Suggested Prolia- she will check on ins coverage for that and let me know  Info given on the medication and disc imp of vit D /exercise and fall prev and smoking cessation  Rev last dexa         Other   Vitamin D deficiency    Vitamin D level is therapeutic with current supplementation Disc importance of this to bone and overall health       Routine general medical examination at a health care facility    Reviewed health habits including diet and exercise and skin cancer prevention Reviewed appropriate screening tests for age  Also reviewed health mt list, fam hx and immunization status , as well as social and family history   See HPI Labs rev  Counseled on smoking cessation  Flu shot today Due for Tdap in Dec-she will return for that  Disc tx opt for OP      Encounter for routine gynecological examination    Routine gyn exam and pap  No new problems or contacts Declines std screen      Relevant Orders   Cytology - PAP   CIGARETTE SMOKER    Disc in detail risks of smoking and possible outcomes including copd, vascular/ heart disease, cancer , respiratory and sinus infections as well as OP Pt voices understanding She plans to try to quit for the new year         Other Visit Diagnoses    Need for influenza vaccination    -  Primary   Relevant Orders   Flu Vaccine QUAD 36+ mos PF IM (Fluarix & Fluzone Quad PF) (Completed)

## 2016-02-26 NOTE — Patient Instructions (Addendum)
You need a tetanus shot in Dec- we will set that up at check out  Prolia injection (every 6 months) is my preferred medicine for fracture prevention  (evista is an option if you quit smoking)  Tell your ins co that you did not tolerate fosamax (but took it almost 5 years)   and would like to try Prolia - let me know and I will take it from there  Do get ready to quit smoking  I sent tessalon to your pharmacy  Flu shot today

## 2016-02-26 NOTE — Assessment & Plan Note (Signed)
Osteopenia is worsening  Took almost 5 y of fosamax and then developed side eff Want to avoid evista due to blood clot risk since she still smokes Suggested Prolia- she will check on ins coverage for that and let me know  Info given on the medication and disc imp of vit D /exercise and fall prev and smoking cessation  Rev last dexa

## 2016-02-26 NOTE — Assessment & Plan Note (Signed)
Intermittent w/o sob Will plan cxr next time she is here Refilled tessalon  Plans to quit smoking Jan 1st or earlier

## 2016-02-26 NOTE — Assessment & Plan Note (Signed)
Reviewed health habits including diet and exercise and skin cancer prevention Reviewed appropriate screening tests for age  Also reviewed health mt list, fam hx and immunization status , as well as social and family history   See HPI Labs rev  Counseled on smoking cessation  Flu shot today Due for Tdap in Dec-she will return for that  Disc tx opt for OP

## 2016-02-26 NOTE — Assessment & Plan Note (Signed)
Hypothyroidism  Pt has no clinical changes No change in energy level/ hair or skin/ edema and no tremor Lab Results  Component Value Date   TSH 1.48 02/20/2016

## 2016-02-26 NOTE — Assessment & Plan Note (Signed)
Disc in detail risks of smoking and possible outcomes including copd, vascular/ heart disease, cancer , respiratory and sinus infections as well as OP Pt voices understanding She plans to try to quit for the new year

## 2016-02-26 NOTE — Assessment & Plan Note (Signed)
Routine gyn exam and pap  No new problems or contacts Declines std screen

## 2016-02-26 NOTE — Assessment & Plan Note (Signed)
Vitamin D level is therapeutic with current supplementation Disc importance of this to bone and overall health  

## 2016-02-27 LAB — CYTOLOGY - PAP

## 2016-02-29 ENCOUNTER — Encounter: Payer: Self-pay | Admitting: *Deleted

## 2016-05-20 ENCOUNTER — Ambulatory Visit: Payer: 59

## 2016-08-22 DIAGNOSIS — E28319 Asymptomatic premature menopause: Secondary | ICD-10-CM | POA: Diagnosis not present

## 2016-08-22 DIAGNOSIS — E0510 Thyrotoxicosis with toxic single thyroid nodule without thyrotoxic crisis or storm: Secondary | ICD-10-CM | POA: Diagnosis not present

## 2016-08-22 DIAGNOSIS — E28310 Symptomatic premature menopause: Secondary | ICD-10-CM | POA: Diagnosis not present

## 2016-08-22 DIAGNOSIS — E784 Other hyperlipidemia: Secondary | ICD-10-CM | POA: Diagnosis not present

## 2016-08-29 ENCOUNTER — Other Ambulatory Visit: Payer: Self-pay | Admitting: Family Medicine

## 2016-08-29 DIAGNOSIS — D34 Benign neoplasm of thyroid gland: Secondary | ICD-10-CM | POA: Diagnosis not present

## 2016-08-29 DIAGNOSIS — E890 Postprocedural hypothyroidism: Secondary | ICD-10-CM | POA: Diagnosis not present

## 2016-08-29 DIAGNOSIS — E784 Other hyperlipidemia: Secondary | ICD-10-CM | POA: Diagnosis not present

## 2016-08-29 NOTE — Telephone Encounter (Signed)
Pt had CPE on 02/26/16, last filled on 02/26/16 #60 tabs with 1 additional refill, Dr. Glori Bickers is out of the office

## 2016-08-29 NOTE — Telephone Encounter (Signed)
Approved: #[phone] x 0 

## 2016-09-01 NOTE — Telephone Encounter (Signed)
Rx called in as prescribed 

## 2017-03-02 DIAGNOSIS — E890 Postprocedural hypothyroidism: Secondary | ICD-10-CM | POA: Diagnosis not present

## 2017-03-02 DIAGNOSIS — E0510 Thyrotoxicosis with toxic single thyroid nodule without thyrotoxic crisis or storm: Secondary | ICD-10-CM | POA: Diagnosis not present

## 2017-03-02 DIAGNOSIS — E28310 Symptomatic premature menopause: Secondary | ICD-10-CM | POA: Diagnosis not present

## 2017-03-10 DIAGNOSIS — E890 Postprocedural hypothyroidism: Secondary | ICD-10-CM | POA: Diagnosis not present

## 2017-03-10 DIAGNOSIS — E0510 Thyrotoxicosis with toxic single thyroid nodule without thyrotoxic crisis or storm: Secondary | ICD-10-CM | POA: Diagnosis not present

## 2017-03-10 DIAGNOSIS — E784 Other hyperlipidemia: Secondary | ICD-10-CM | POA: Diagnosis not present

## 2017-06-02 ENCOUNTER — Encounter: Payer: Self-pay | Admitting: Family Medicine

## 2017-06-02 ENCOUNTER — Ambulatory Visit (INDEPENDENT_AMBULATORY_CARE_PROVIDER_SITE_OTHER): Payer: 59 | Admitting: Family Medicine

## 2017-06-02 ENCOUNTER — Ambulatory Visit (INDEPENDENT_AMBULATORY_CARE_PROVIDER_SITE_OTHER)
Admission: RE | Admit: 2017-06-02 | Discharge: 2017-06-02 | Disposition: A | Discharge status: Home / Self Care | Payer: 59 | Source: Ambulatory Visit | Attending: Family Medicine | Admitting: Family Medicine

## 2017-06-02 VITALS — BP 128/62 | HR 65 | Temp 97.5°F | Ht 62.75 in | Wt 141.0 lb

## 2017-06-02 DIAGNOSIS — Z0000 Encounter for general adult medical examination without abnormal findings: Secondary | ICD-10-CM

## 2017-06-02 DIAGNOSIS — M899 Disorder of bone, unspecified: Secondary | ICD-10-CM

## 2017-06-02 DIAGNOSIS — E039 Hypothyroidism, unspecified: Secondary | ICD-10-CM | POA: Diagnosis not present

## 2017-06-02 DIAGNOSIS — M949 Disorder of cartilage, unspecified: Secondary | ICD-10-CM

## 2017-06-02 DIAGNOSIS — J410 Simple chronic bronchitis: Secondary | ICD-10-CM | POA: Diagnosis not present

## 2017-06-02 DIAGNOSIS — Z23 Encounter for immunization: Secondary | ICD-10-CM

## 2017-06-02 DIAGNOSIS — E559 Vitamin D deficiency, unspecified: Secondary | ICD-10-CM

## 2017-06-02 DIAGNOSIS — F17200 Nicotine dependence, unspecified, uncomplicated: Secondary | ICD-10-CM

## 2017-06-02 DIAGNOSIS — R05 Cough: Secondary | ICD-10-CM | POA: Diagnosis not present

## 2017-06-02 MED ORDER — SUMATRIPTAN SUCCINATE 100 MG PO TABS: ORAL_TABLET | ORAL | 3 refills | Status: AC

## 2017-06-02 MED ORDER — BENZONATATE 200 MG PO CAPS: 200.0000 mg | ORAL_CAPSULE | Freq: Three times a day (TID) | ORAL | 5 refills | Status: DC | PRN

## 2017-06-02 NOTE — Progress Notes (Signed)
Subjective:    Patient ID: Regina Ruiz, female    DOB: 07/02/1955, 61 y.o.   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    [facility] Readings from Last 3 Encounters:  06/02/17 141 lb (64 kg)  02/26/16 139 lb (63 kg)  02/05/15 135 lb (61.2 kg)  taking care of herself  Active job-needs to exercise / would like to loose a bit of weight   (has an exercise room where she lives)  She swam in the summer  Eating healthy /better than she was  25.18 kg/m   Tetanus shot 12/07- due  Will get it today   Mammogram 8/17- due for one -she will schedule her own at [facility] breast exam  Sister had breast cancer   Flu shot 9/17 -will get today   Pap 9/17 nl  No gyn problems  No new partners or need for std screen   Colonoscopy 7/12 - hyperplastic polyps with 10 y recall    dexa 8/17 worsened osteopenia  Due for vit D check  Taking vit D Arm fx in the past  No falls  No new fractures   Smoking status- about the same - about 1 ppd  She is trying to think about quitting /not ready yet  Cough- is not so bad in general but needs tessalon renewed  Sob-not as bad as it was in the past  She has smoked for years   Will do a cxr today   Due for labs today   Hypothyroidism  Pt has no clinical changes No change in energy level/ hair or skin/ edema and no tremor Lab Results  Component Value Date   TSH 1.48 02/20/2016     Seeing endocrinologist-yearly   Patient Active Problem List   Diagnosis Date Noted  . Smokers' cough ([place]) 02/26/2016  . Encounter for routine gynecological examination 02/26/2016  . Nocturnal leg cramps 07/12/2013  . Unspecified gastritis and gastroduodenitis without mention of hemorrhage 10/04/2012  . Routine gynecological examination 12/16/2011  . Sleep disorder 03/21/2011  . Hypothyroid 02/19/2011  . Routine general medical  examination at a health care facility 10/31/2010  . Trochanteric bursitis of left hip 09/09/2010  . Sciatica of left side 09/09/2010  . Vitamin D deficiency 12/06/2009  . ALLERGIC CONJUNCTIVITIS 10/23/2009  . PULMONARY NODULE 08/30/2008  . CIGARETTE SMOKER 08/25/2008  . Anxiety 09/01/2006  . C O P D 09/01/2006  . Disorder of bone and cartilage 04/16/2006   Past Medical History:  Diagnosis Date  . Anxiety   . Colitis   . COPD (chronic obstructive pulmonary disease) ([place])   . Depression   . Osteopenia   . Thyroid disease    Past Surgical History:  Procedure Laterality Date  . DILATION AND CURETTAGE OF UTERUS    . endomet polyp  6/12   removed   . ENDOMETRIAL BIOPSY  6/12   Dr Phineas Real- negative  . HYSTEROSCOPY  6.7.12   [facility]: ENDOMETRIAL POLYP   Social History   Tobacco Use  . Smoking status: Current Every Day Smoker    Packs/day: 0.50    Types: Cigarettes  . Smokeless tobacco: Never Used  Substance Use Topics  . Alcohol use: Yes    Alcohol/week: 0.0 oz    Comment: rare  . Drug use: No   Family History  Problem Relation Age of  Onset  . Cancer Father        lung cancer and brain cancer  . Breast cancer Sister   . Stroke Maternal Grandfather   . Stroke Paternal Grandfather    Allergies  Allergen Reactions  . Butalbital-Aspirin-Caffeine     REACTION: unspecified  . Fosamax [Alendronate Sodium]     GI  . Meperidine Hcl     REACTION: unspecified  . Zolpidem Tartrate    Current Outpatient Medications on File Prior to Visit  Medication Sig Dispense Refill  . ALPRAZolam (XANAX) 0.25 MG tablet TAKE 1 TAB BY MOUTH 2 TIMES DAILY AS NEEDED 60 tablet 0  . Cholecalciferol (VITAMIN D) 400 UNITS capsule Take 400 Units by mouth daily.      [name] levothyroxine (SYNTHROID, LEVOTHROID) 88 MCG tablet Take 88 mcg by mouth daily.      . Multiple Vitamin (STRESS B PO) Take 2 tablets by mouth 2 (two) times daily.     No current facility-administered medications on file prior to  visit.     Review of Systems  Constitutional: Negative for activity change, appetite change, fatigue, fever and unexpected weight change.  HENT: Negative for congestion, ear pain, rhinorrhea, sinus pressure and sore throat.   Eyes: Negative for pain, redness and visual disturbance.  Respiratory: Positive for cough. Negative for chest tightness, wheezing and stridor.   Cardiovascular: Negative for chest pain and palpitations.  Gastrointestinal: Negative for abdominal pain, blood in stool, constipation and diarrhea.  Endocrine: Negative for polydipsia and polyuria.  Genitourinary: Negative for dysuria, frequency and urgency.  Musculoskeletal: Negative for arthralgias, back pain and myalgias.  Skin: Negative for pallor and rash.  Allergic/Immunologic: Negative for environmental allergies.  Neurological: Negative for dizziness, syncope and headaches.  Hematological: Negative for adenopathy. Does not bruise/bleed easily.  Psychiatric/Behavioral: Negative for decreased concentration and dysphoric mood. The patient is not nervous/anxious.        Objective:   Physical Exam  Constitutional: She appears well-developed and well-nourished. No distress.  Well appearing   HENT:  Head: Normocephalic and atraumatic.  Right Ear: External ear normal.  Left Ear: External ear normal.  Mouth/Throat: Oropharynx is clear and moist.  Eyes: Conjunctivae and EOM are normal. Pupils are equal, round, and reactive to light. No scleral icterus.  Neck: Normal range of motion. Neck supple. No JVD present. Carotid bruit is not present. No thyromegaly present.  Cardiovascular: Normal rate, regular rhythm, normal heart sounds and intact distal pulses. Exam reveals no gallop.  Pulmonary/Chest: Effort normal and breath sounds normal. No respiratory distress. She has no wheezes. She exhibits no tenderness.  Diffusely distant bs   No wheeze Nl exp phase  Abdominal: Soft. Bowel sounds are normal. She exhibits no  distension, no abdominal bruit and no mass. There is no tenderness.  Genitourinary: No breast swelling, tenderness, discharge or bleeding.  Genitourinary Comments: Breast exam: No mass, nodules, thickening, tenderness, bulging, retraction, inflamation, nipple discharge or skin changes noted.  No axillary or clavicular LA.      Musculoskeletal: Normal range of motion. She exhibits no edema or tenderness.  Lymphadenopathy:    She has no cervical adenopathy.  Neurological: She is alert. She has normal reflexes. No cranial nerve deficit. She exhibits normal muscle tone. Coordination normal.  Skin: Skin is warm and dry. No rash noted. No erythema. No pallor.  Solar lentigines diffusely  Some solar againg   Psychiatric: She has a normal mood and affect.          Assessment &  Plan:   Problem List Items Addressed This Visit      Respiratory   Smokers' cough ([place])    Pt continues to smoke  Has continued cough  cxr today  Counseled on quitting Tessalon px       Relevant Orders   DG Chest 2 View (Completed)     Endocrine   Hypothyroid    TSH today  Seen by endocrinology       Relevant Orders   TSH     Musculoskeletal and Integument   Disorder of bone and cartilage    Osteopenia 8/17 dexa  Due for vit D check  No new falls or fx Takes D, exercises  conitnues to smoke-counseling to quit         Other   CIGARETTE SMOKER    Disc in detail risks of smoking and possible outcomes including copd, vascular/ heart disease, cancer , respiratory and sinus infections  Pt voices understanding She is not ready to quit  cxr today for smokers cough  Given info today on the lung cancer screening program with CT  (she has had stable nodules in the past)- she will let us know if interested       Routine general medical examination at a health care facility - Primary    Reviewed health habits including diet and exercise and skin cancer prevention Reviewed appropriate screening tests  for age  Also reviewed health mt list, fam hx and immunization status , as well as social and family history   See HPI She will schedule her own mammogram  Flu shot today  Lab today  Counseled on smoking cessation       Relevant Orders   CBC with Differential/Platelet   Comprehensive metabolic panel   Lipid panel   TSH   Vitamin D deficiency    Level today  Pt has osteopenia       Relevant Orders   VITAMIN D 25 Hydroxy (Vit-D Deficiency, Fractures)    Other Visit Diagnoses    Need for influenza vaccination       Relevant Orders   Flu Vaccine QUAD 6+ mos PF IM (Fluarix Quad PF) (Completed)   Need for Tdap vaccination       Relevant Orders   Tdap vaccine greater than or equal to 7yo IM (Completed)

## 2017-06-02 NOTE — Patient Instructions (Addendum)
Aim for exercise 5 days per week 30 minutes at least   Don't forget to schedule your annual mammogram   Get at least 1000 iu vitamin D in addition to your other vitamins   Here is some info you the CT lung cancer screening program- if your insurance covers it I would recommend the program Keep thinking about quitting smoking  Chest xray today    Flu shot today  Tdap vaccine today  Labs today

## 2017-06-03 ENCOUNTER — Telehealth: Payer: Self-pay | Admitting: Family Medicine

## 2017-06-03 DIAGNOSIS — J410 Simple chronic bronchitis: Secondary | ICD-10-CM

## 2017-06-03 DIAGNOSIS — J449 Chronic obstructive pulmonary disease, unspecified: Secondary | ICD-10-CM

## 2017-06-03 LAB — LIPID PANEL
CHOL/HDL RATIO: 3
Cholesterol: 157 mg/dL (ref 0–200)
HDL: 51.4 mg/dL (ref >39.00)
LDL Cholesterol: 66 mg/dL (ref 0–99)
NONHDL: 105.53
Triglycerides: 196 mg/dL — ABNORMAL HIGH (ref 0.0–149.0)
VLDL: 39.2 mg/dL (ref 0.0–40.0)

## 2017-06-03 LAB — CBC WITH DIFFERENTIAL/PLATELET
BASOS ABS: 0.1 10*3/uL (ref 0.0–0.1)
Basophils Relative: 1.3 % (ref 0.0–3.0)
EOS ABS: 0.1 10*3/uL (ref 0.0–0.7)
Eosinophils Relative: 1.2 % (ref 0.0–5.0)
HCT: 43.3 % (ref 36.0–46.0)
Hemoglobin: 14.7 g/dL (ref 12.0–15.0)
LYMPHS ABS: 2.6 10*3/uL (ref 0.7–4.0)
LYMPHS PCT: 25.4 % (ref 12.0–46.0)
MCHC: 33.9 g/dL (ref 30.0–36.0)
MCV: 92.6 fl (ref 78.0–100.0)
MONO ABS: 0.9 10*3/uL (ref 0.1–1.0)
Monocytes Relative: 8.7 % (ref 3.0–12.0)
NEUTROS ABS: 6.4 10*3/uL (ref 1.4–7.7)
NEUTROS PCT: 63.4 % (ref 43.0–77.0)
PLATELETS: 313 10*3/uL (ref 150.0–400.0)
RBC: 4.67 Mil/uL (ref 3.87–5.11)
RDW: 13.4 % (ref 11.5–15.5)
WBC: 10.1 10*3/uL (ref 4.0–10.5)

## 2017-06-03 LAB — COMPREHENSIVE METABOLIC PANEL
ALT: 12 U/L (ref 0–35)
AST: 16 U/L (ref 0–37)
Albumin: 4.5 g/dL (ref 3.5–5.2)
Alkaline Phosphatase: 133 U/L — ABNORMAL HIGH (ref 39–117)
BUN: 18 mg/dL (ref 6–23)
CO2: 30 mEq/L (ref 19–32)
Calcium: 9.2 mg/dL (ref 8.4–10.5)
Chloride: 103 mEq/L (ref 96–112)
Creatinine, Ser: 1.06 mg/dL (ref 0.40–1.20)
GFR: 56.2 mL/min — ABNORMAL LOW (ref >60.00)
Glucose, Bld: 82 mg/dL (ref 70–99)
Potassium: 3.9 mEq/L (ref 3.5–5.1)
Sodium: 139 mEq/L (ref 135–145)
Total Bilirubin: 0.3 mg/dL (ref 0.2–1.2)
Total Protein: 7.4 g/dL (ref 6.0–8.3)

## 2017-06-03 LAB — TSH: TSH: 24.02 u[IU]/mL — ABNORMAL HIGH (ref 0.35–4.50)

## 2017-06-03 LAB — VITAMIN D 25 HYDROXY (VIT D DEFICIENCY, FRACTURES): VITD: 45.77 ng/mL (ref 30.00–100.00)

## 2017-06-03 NOTE — Assessment & Plan Note (Signed)
Disc in detail risks of smoking and possible outcomes including copd, vascular/ heart disease, cancer , respiratory and sinus infections  Pt voices understanding She is not ready to quit  cxr today for smokers cough  Given info today on the lung cancer screening program with CT  (she has had stable nodules in the past)- she will let us know if interested

## 2017-06-03 NOTE — Telephone Encounter (Signed)
-----   Message from Tammi Sou, [place] sent at 06/03/2017  8:22 AM EST ----- Pt notified of xray results and Dr. Marliss Coots comments and recommendations. Pt does agree with referral to pulmo. doc, I advise pt our [facility] will call pt to schedule appt

## 2017-06-03 NOTE — Assessment & Plan Note (Signed)
Level today  Pt has osteopenia

## 2017-06-03 NOTE — Assessment & Plan Note (Signed)
Osteopenia 8/17 dexa  Due for vit D check  No new falls or fx Takes D, exercises  conitnues to smoke-counseling to quit

## 2017-06-03 NOTE — Assessment & Plan Note (Signed)
Reviewed health habits including diet and exercise and skin cancer prevention Reviewed appropriate screening tests for age  Also reviewed health mt list, fam hx and immunization status , as well as social and family history   See HPI She will schedule her own mammogram  Flu shot today  Lab today  Counseled on smoking cessation

## 2017-06-03 NOTE — Telephone Encounter (Signed)
Ref done  Will route to [facility] 

## 2017-06-03 NOTE — Telephone Encounter (Signed)
Appt made and patient aware.  

## 2017-06-03 NOTE — Assessment & Plan Note (Signed)
TSH today  Seen by endocrinology

## 2017-06-03 NOTE — Assessment & Plan Note (Signed)
Pt continues to smoke  Has continued cough  cxr today  Counseled on quitting Tessalon px

## 2017-06-03 NOTE — Assessment & Plan Note (Addendum)
Noted on cxr  Pt has cough/no other symptoms  cxr today

## 2017-06-29 ENCOUNTER — Encounter: Payer: Self-pay | Admitting: Internal Medicine

## 2017-06-29 ENCOUNTER — Ambulatory Visit: Payer: 59 | Admitting: Internal Medicine

## 2017-06-29 VITALS — BP 128/60 | HR 70 | Ht 62.0 in | Wt 142.0 lb

## 2017-06-29 DIAGNOSIS — J8410 Pulmonary fibrosis, unspecified: Secondary | ICD-10-CM

## 2017-06-29 DIAGNOSIS — F17200 Nicotine dependence, unspecified, uncomplicated: Secondary | ICD-10-CM

## 2017-06-29 DIAGNOSIS — R05 Cough: Secondary | ICD-10-CM

## 2017-06-29 DIAGNOSIS — F17210 Nicotine dependence, cigarettes, uncomplicated: Secondary | ICD-10-CM

## 2017-06-29 DIAGNOSIS — R058 Other specified cough: Secondary | ICD-10-CM | POA: Insufficient documentation

## 2017-06-29 DIAGNOSIS — J449 Chronic obstructive pulmonary disease, unspecified: Secondary | ICD-10-CM | POA: Diagnosis not present

## 2017-06-29 MED ORDER — FAMOTIDINE 20 MG PO TABS: ORAL_TABLET | ORAL | 2 refills | Status: DC

## 2017-06-29 MED ORDER — PANTOPRAZOLE SODIUM 40 MG PO TBEC: 40.0000 mg | DELAYED_RELEASE_TABLET | Freq: Every day | ORAL | 2 refills | Status: DC

## 2017-06-29 NOTE — Assessment & Plan Note (Signed)
I suspect the only has mild copd clinically with the main new finding related to ? ILD (see separate a/p)   Since really not limited from desired activities or perceiving the need for saba, would focus for now on committing to quit smoking and f/u here with full pfts before empirical trials of any bronchodilators, esp since the latter have never been shown to change the natural h/o copd

## 2017-06-29 NOTE — Patient Instructions (Addendum)
For drainage / throat tickle try take CHLORPHENIRAMINE  4 mg - take one every 4 hours as needed - available over the counter- may cause drowsiness so start with just a bedtime dose or two and see how you tolerate it before trying in daytime     Pantoprazole (protonix) 40 mg   Take  30-60 min before first meal of the day and Pepcid (famotidine)  20 mg one @  bedtime until return to office - this is the best way to tell whether stomach acid is contributing to your problem.    GERD (REFLUX)  is an extremely common cause of respiratory symptoms just like yours , many times with no obvious heartburn at all.    It can be treated with medication, but also with lifestyle changes including elevation of the head of your bed (ideally with 6 inch  bed blocks),  Smoking cessation, avoidance of late meals, excessive alcohol, and avoid fatty foods, chocolate, peppermint, colas, red wine, and acidic juices such as orange juice.  NO MINT OR MENTHOL PRODUCTS SO NO COUGH DROPS   USE SUGARLESS CANDY INSTEAD (Jolley ranchers or Stover's or Life Savers) or even ice chips will also do - the key is to swallow to prevent all throat clearing. NO OIL BASED VITAMINS - use powdered substitutes.  The key is to stop smoking completely before smoking completely stops you!    Please schedule a follow up office visit in 4 weeks, sooner if needed with pfts and repeat cxr

## 2017-06-29 NOTE — Assessment & Plan Note (Signed)
Cough is most c/w Upper airway cough syndrome (previously labeled PNDS),  is so named because it's frequently impossible to sort out how much is  CR/sinusitis with freq throat clearing (which can be related to primary GERD)   vs  causing  secondary (" extra esophageal")  GERD from wide swings in gastric pressure that occur with throat clearing, often  promoting self use of mint and menthol lozenges that reduce the lower esophageal sphincter tone and exacerbate the problem further in a cyclical fashion.   These are the same pts (now being labeled as having "irritable larynx syndrome" by some cough centers) who not infrequently have a history of having failed to tolerate ace inhibitors,  dry powder inhalers or biphosphonates or report having atypical/extraesophageal reflux symptoms that don't respond to standard doses of PPI  and are easily confused as having aecopd or asthma flares by even experienced allergists/ pulmonologists (myself included).    rec Trial of gerd rx/ 1st gen H1 blockers per guidelines pending f/u in 4-6 weeks

## 2017-06-29 NOTE — Assessment & Plan Note (Signed)
DDx for pulmonary fibrosis  includes idiopathic pulmonary fibrosis, pulmonary fibrosis associated with rheumatologic diseases (which have a relatively benign course in most cases) , adverse effect from  drugs such as chemotherapy or amiodarone exposure, nonspecific interstitial pneumonia which is typically steroid responsive, and chronic hypersensitivity pneumonitis.   In active  smokers Langerhan's Cell  Histiocyctosis (eosinophilic granuomatosis),  DIP,  and Respiratory Bronchiolitis ILD also need to be considered,  With the latter the most likely dx at this point   rx for RBILD is smoking cessation/ will f/u with pfts and cxr and then consider HRCT next    Discussed in detail all the  indications, usual  risks and alternatives  relative to the benefits with patient who agrees to proceed with conservative  W/u  as outlined     Total time devoted to counseling  > 50 % of initial 60 min office visit:  review case with pt/ discussion of options/alternatives/ personally creating written customized instructions  in presence of pt  then going over those specific  Instructions directly with the pt including how to use all of the meds but in particular covering each new medication in detail and the difference between the maintenance= "automatic" meds and the prns using an action plan format for the latter (If this problem/symptom => do that organization reading Left to right).  Please see AVS from this visit for a full list of these instructions which I personally wrote for this pt and  are unique to this visit.

## 2017-06-29 NOTE — Progress Notes (Signed)
Subjective:     Patient ID: Regina Ruiz, female   DOB: Oct 02, 1955,   MRN: [id number]  HPI   44 yowf active smoker with onset in her early 79s intermittent sob in setting of uri/bronchitis and in 87s more chronic doe on as needed saba esp in winter.    06/29/2017 [facility] visit/ Draxton Luu   Chief Complaint  Patient presents with  . Pulmonary Consult    Referred by Dr. Loura Pardon. Pt c/o PND and cough for the past month. Cough is occ prod with clear sputum.    on best days can do treadmill x 30 min up to 4-108mph   At slight incline MMRC1 = can walk nl pace, flat grade, can't hurry or go uphills or steps s sob   Never uses saba with ex  Cough is day > noct s am flare or purulent sputum  No obvious day to day or daytime variability or assoc   mucus plugs or hemoptysis or cp or chest tightness, subjective wheeze or overt sinus or hb symptoms. No unusual exposure hx or h/o childhood pna/ asthma or knowledge of premature birth.  Sleeping ok flat without nocturnal  or early am exacerbation  of respiratory  c/o's or need for noct saba. Also denies any obvious fluctuation of symptoms with weather or environmental changes or other aggravating or alleviating factors except as outlined above   Current Allergies, Complete Past Medical History, Past Surgical History, Family History, and Social History were reviewed in [employer] record.  ROS  The following are not active complaints unless bolded Hoarseness, sore throat, dysphagia, dental problems, itching, sneezing,  nasal congestion or discharge of excess mucus or purulent secretions, ear ache,   fever, chills, sweats, unintended wt loss or wt gain, classically pleuritic or exertional cp,  orthopnea pnd or leg swelling, presyncope, palpitations, abdominal pain, anorexia, nausea, vomiting, diarrhea  or change in bowel habits or change in bladder habits, change in stools or change in urine, dysuria, hematuria,  rash,  arthralgias, visual complaints, headache, numbness, weakness or ataxia or problems with walking or coordination,  change in mood/affect or memory.        Current Meds  Medication Sig  . ALPRAZolam (XANAX) 0.25 MG tablet TAKE 1 TAB BY MOUTH 2 TIMES DAILY AS NEEDED  . benzonatate (TESSALON) 200 MG capsule Take 1 capsule (200 mg total) by mouth 3 (three) times daily as needed for cough (swallow whole).  . Cholecalciferol (VITAMIN D) 400 UNITS capsule Take 400 Units by mouth daily.    [name] levothyroxine (SYNTHROID, LEVOTHROID) 88 MCG tablet Take 88 mcg by mouth daily.    . Multiple Vitamins-Minerals (MULTIVITAMIN WITH MINERALS) tablet Take 1 tablet by mouth daily.  . SUMAtriptan (IMITREX) 100 MG tablet Take 1 pill orally for migraine - if necessary may repeat once in 2 hours         Review of Systems     Objective:   Physical Exam    amb wf slt gruff voice texture / nad      Wt Readings from Last 3 Encounters:  06/29/17 142 lb (64.4 kg)  06/02/17 141 lb (64 kg)  02/26/16 139 lb (63 kg)     Vital signs reviewed - Note on arrival 02 sats  95% on RA        HEENT: nl dentition, turbinates bilaterally, and oropharynx. Nl external ear canals without cough reflex   NECK :  without JVD/Nodes/TM/ nl carotid upstrokes bilaterally  LUNGS: no acc muscle use,  Nl contour chest which is clear to A and P bilaterally without cough on insp or exp maneuvers   CV:  RRR  no s3 or murmur or increase in P2, and no edema   ABD:  soft and nontender with nl inspiratory excursion in the supine position. No bruits or organomegaly appreciated, bowel sounds nl  MS:  Nl gait/ ext warm without deformities, calf tenderness, cyanosis or clubbing No obvious joint restrictions   SKIN: warm and dry without lesions    NEURO:  alert, approp, nl sensorium with  no motor or cerebellar deficits apparent.       I personally reviewed images and agree with radiology impression as follows:  CXR:    06/02/17 Widespread pulmonary fibrotic type change and emphysematous change. No frank edema or consolidation. Apical scarring bilaterally noted. Emphysema  Assessment:

## 2017-06-29 NOTE — Assessment & Plan Note (Signed)
>   3 min discussion I reviewed the Fletcher curve with the patient that basically indicates  if you quit smoking when your best day FEV1 is  preserved (as is probably still  the case here)  it is highly unlikely you will progress to severe disease and informed the patient there was  no medication on the market that has proven to alter the curve/ its downward trajectory  or the likelihood of progression of their disease(unlike other chronic medical conditions such as atheroclerosis where we do think we can change the natural hx with risk reducing meds)    Therefore stopping smoking and maintaining abstinence are  the most important aspects of care, not choice of inhalers or for that matter, doctors.   Treatment other than smoking cessation  is entirely directed by severity of symptoms and focused also on reducing exacerbations, not attempting to change the natural history of the disease.

## 2017-07-30 ENCOUNTER — Encounter: Payer: Self-pay | Admitting: Internal Medicine

## 2017-07-30 ENCOUNTER — Ambulatory Visit (INDEPENDENT_AMBULATORY_CARE_PROVIDER_SITE_OTHER)
Admission: RE | Admit: 2017-07-30 | Discharge: 2017-07-30 | Disposition: A | Discharge status: Home / Self Care | Payer: 59 | Source: Ambulatory Visit | Attending: Internal Medicine | Admitting: Internal Medicine

## 2017-07-30 ENCOUNTER — Ambulatory Visit (INDEPENDENT_AMBULATORY_CARE_PROVIDER_SITE_OTHER): Payer: 59 | Admitting: Internal Medicine

## 2017-07-30 ENCOUNTER — Ambulatory Visit: Payer: 59 | Admitting: Internal Medicine

## 2017-07-30 VITALS — BP 118/64 | HR 73 | Ht 64.0 in | Wt 141.0 lb

## 2017-07-30 DIAGNOSIS — J8410 Pulmonary fibrosis, unspecified: Secondary | ICD-10-CM

## 2017-07-30 DIAGNOSIS — J449 Chronic obstructive pulmonary disease, unspecified: Secondary | ICD-10-CM | POA: Diagnosis not present

## 2017-07-30 DIAGNOSIS — F17210 Nicotine dependence, cigarettes, uncomplicated: Secondary | ICD-10-CM

## 2017-07-30 DIAGNOSIS — F17200 Nicotine dependence, unspecified, uncomplicated: Secondary | ICD-10-CM

## 2017-07-30 LAB — PULMONARY FUNCTION TEST
DL/VA % pred: 59 %
DL/VA: 2.86 ml/min/mmHg/L
DLCO COR % PRED: 42 %
DLCO UNC % PRED: 44 %
DLCO cor: 10.36 ml/min/mmHg
DLCO unc: 10.75 ml/min/mmHg
FEF 25-75 POST: 0.56 L/s
FEF 25-75 PRE: 0.63 L/s
FEF2575-%Change-Post: -11 %
FEF2575-%Pred-Post: 23 %
FEF2575-%Pred-Pre: 27 %
FEV1-%Change-Post: -1 %
FEV1-%PRED-POST: 50 %
FEV1-%PRED-PRE: 51 %
FEV1-POST: 1.29 L
FEV1-Pre: 1.31 L
FEV1FVC-%CHANGE-POST: 0 %
FEV1FVC-%PRED-PRE: 72 %
FEV6-%CHANGE-POST: -2 %
FEV6-%PRED-PRE: 71 %
FEV6-%Pred-Post: 69 %
FEV6-POST: 2.21 L
FEV6-Pre: 2.27 L
FEV6FVC-%Change-Post: 0 %
FEV6FVC-%PRED-POST: 100 %
FEV6FVC-%Pred-Pre: 101 %
FVC-%Change-Post: -2 %
FVC-%Pred-Post: 68 %
FVC-%Pred-Pre: 69 %
FVC-PRE: 2.31 L
FVC-Post: 2.26 L
POST FEV6/FVC RATIO: 98 %
PRE FEV1/FVC RATIO: 57 %
Post FEV1/FVC ratio: 57 %
Pre FEV6/FVC Ratio: 99 %
RV % pred: 110 %
RV: 2.21 L
TLC % PRED: 91 %
TLC: 4.64 L

## 2017-07-30 MED ORDER — BUDESONIDE-FORMOTEROL FUMARATE 160-4.5 MCG/ACT IN AERO: 2.0000 | INHALATION_SPRAY | Freq: Two times a day (BID) | RESPIRATORY_TRACT | 11 refills | Status: DC

## 2017-07-30 MED ORDER — BUDESONIDE-FORMOTEROL FUMARATE 160-4.5 MCG/ACT IN AERO: 2.0000 | INHALATION_SPRAY | Freq: Two times a day (BID) | RESPIRATORY_TRACT | 0 refills | Status: DC

## 2017-07-30 NOTE — Patient Instructions (Addendum)
Symbicort 160 up to 2 pffs every 12 hours   The key is to stop smoking completely before smoking completely stops you!    Please remember to go to the  x-ray department downstairs in the basement  for your tests - we will call you with the results when they are available.      Please schedule a follow up visit in 3 months but call sooner if needed

## 2017-07-30 NOTE — Progress Notes (Signed)
Spoke with pt and notified of results per Dr. Wert. Pt verbalized understanding and denied any questions. 

## 2017-07-30 NOTE — Progress Notes (Signed)
PFT done today. 

## 2017-07-30 NOTE — Progress Notes (Signed)
Subjective:     Patient ID: Regina Ruiz, female   DOB: December 23, 1955,   MRN: [id number]  HPI   54 yowf active smoker with onset in her early 33s intermittent sob in setting of uri/bronchitis and in 16s more chronic doe on as needed saba esp in winter.    06/29/2017 1st [facility] office visit/ Regina Ruiz   Chief Complaint  Patient presents with  . Pulmonary Consult    Referred by Dr. Loura Pardon. Pt c/o PND and cough for the past month. Cough is occ prod with clear sputum.    on best days can do treadmill x 30 min up to 4-14mph   At slight incline MMRC1 = can walk nl pace, flat grade, can't hurry or go uphills or steps s sob   Never uses saba with ex  Cough is day > noct s am flare or purulent sputum rec For drainage / throat tickle try take CHLORPHENIRAMINE  4 mg - take one every 4 hours as needed - available over the counter- may cause drowsiness so start with just a bedtime dose or two and see how you tolerate it before trying in daytime   Pantoprazole (protonix) 40 mg   Take  30-60 min before first meal of the day and Pepcid (famotidine)  20 mg one @  bedtime until return to office - this is the best way to tell whether stomach acid is contributing to your problem.   GERD  Diet  The key is to stop smoking completely before smoking completely stops you!   Please schedule a follow up office visit in 4 weeks, sooner if needed with pfts and repeat cxr     07/30/2017  f/u ov/Regina Ruiz re:  Copd GOLD II still smoking  Chief Complaint  Patient presents with  . Follow-up    PFT results,SOB with activity,cough with clear,yellow sputum   goal is back on treadmill but hasn't done in 6 months Dyspnea:  MMRC2 = can't walk a nl pace on a flat grade s sob but does fine slow and flat  Cough: better sometimes worse in am tsp or two white mucus  Sleep: 6 h and restless on 2 pillows   No obvious day to day or daytime variability or assoc excess/ purulent sputum or mucus plugs or hemoptysis or cp or  chest tightness, subjective wheeze or overt sinus or hb symptoms. No unusual exposure hx or h/o childhood pna/ asthma or knowledge of premature birth.  Sleeping ok flat without nocturnal  or early am exacerbation  of respiratory  c/o's or need for noct saba. Also denies any obvious fluctuation of symptoms with weather or environmental changes or other aggravating or alleviating factors except as outlined above   Current Allergies, Complete Past Medical History, Past Surgical History, Family History, and Social History were reviewed in [employer] record.  ROS  The following are not active complaints unless bolded Hoarseness, sore throat, dysphagia, dental problems, itching, sneezing,  nasal congestion or discharge of excess mucus or purulent secretions, ear ache,   fever, chills, sweats, unintended wt loss or wt gain, classically pleuritic or exertional cp,  orthopnea pnd or leg swelling, presyncope, palpitations, abdominal pain, anorexia, nausea, vomiting, diarrhea  or change in bowel habits or change in bladder habits, change in stools or change in urine, dysuria, hematuria,  rash, arthralgias, visual complaints, headache, numbness, weakness or ataxia or problems with walking or coordination,  change in mood/affect or memory.  Current Meds  Medication Sig  . ALPRAZolam (XANAX) 0.25 MG tablet TAKE 1 TAB BY MOUTH 2 TIMES DAILY AS NEEDED  . benzonatate (TESSALON) 200 MG capsule Take 1 capsule (200 mg total) by mouth 3 (three) times daily as needed for cough (swallow whole).  . Cholecalciferol (VITAMIN D) 400 UNITS capsule Take 400 Units by mouth daily.    . famotidine (PEPCID) 20 MG tablet One at bedtime  . levothyroxine (SYNTHROID, LEVOTHROID) 88 MCG tablet Take 88 mcg by mouth daily.    . Multiple Vitamins-Minerals (MULTIVITAMIN WITH MINERALS) tablet Take 1 tablet by mouth daily.  . pantoprazole (PROTONIX) 40 MG tablet Take 1 tablet (40 mg total) by mouth daily. Take  30-60 min before first meal of the day  . SUMAtriptan (IMITREX) 100 MG tablet Take 1 pill orally for migraine - if necessary may repeat once in 2 hours               Objective:   Physical Exam  amb wf gruff voice better      07/30/2017      141   06/29/17 142 lb (64.4 kg)  06/02/17 141 lb (64 kg)  02/26/16 139 lb (63 kg)     Vital signs reviewed - Note on arrival 02 sats  98% on RA      HEENT: nl dentition, turbinates bilaterally, and oropharynx. Nl external ear canals without cough reflex   NECK :  without JVD/Nodes/TM/ nl carotid upstrokes bilaterally   LUNGS: no acc muscle use,  Nl contour chest with minimal insp and exp rhonchi bilaterally    CV:  RRR  no s3 or murmur or increase in P2, and no edema   ABD:  soft and nontender with nl inspiratory excursion in the supine position. No bruits or organomegaly appreciated, bowel sounds nl  MS:  Nl gait/ ext warm without deformities, calf tenderness, cyanosis or clubbing No obvious joint restrictions   SKIN: warm and dry without lesions    NEURO:  alert, approp, nl sensorium with  no motor or cerebellar deficits apparent.      .   CXR PA and Lateral:   07/30/2017 :    I personally reviewed images and agree with radiology impression as follows:   COPD. Stable interstitial and reticulonodular changes in the upper lobes. No active disease.      Assessment:

## 2017-07-31 ENCOUNTER — Encounter: Payer: Self-pay | Admitting: Internal Medicine

## 2017-07-31 NOTE — Assessment & Plan Note (Signed)
dlco down but no desats walking c/w RBILD with main rx stop smoking reviewed  F/u q 3 m with walking sats each ov and HRCT if see decline   Discussed in detail all the  indications, usual  risks and alternatives  relative to the benefits with patient who agrees to proceed with conservative f/u as outlined

## 2017-07-31 NOTE — Assessment & Plan Note (Signed)
PFT's  07/30/2017  FEV1 1.31  (51 % ) ratio 57  p no % improvement from saba p none prior to study with DLCO  44/42 % corrects to 59  % for alv volume     - 07/30/2017  After extensive coaching inhaler device  effectiveness =    90% so rx symb 160  2 every 12 hours  - 07/30/2017  Walked RA x 3 laps @ 185 ft each stopped due to  End of study, moderately fast pace, no sob or desat     Active smoker with moderate copd /AB component so first try symb 160 2bid/ consider change to lama/laba per guidelines depending on response to symbicort 160    I had an extended discussion with the patient reviewing all relevant studies completed to date and  lasting 15 to 20 minutes of a 25 minute visit    Each maintenance medication was reviewed in detail including most importantly the difference between maintenance and prns and under what circumstances the prns are to be triggered using an action plan format that is not reflected in the computer generated alphabetically organized AVS.    Please see AVS for specific instructions unique to this visit that I personally wrote and verbalized to the the pt in detail and then reviewed with pt  by my nurse highlighting any  changes in therapy recommended at today's visit to their plan of care.

## 2017-07-31 NOTE — Assessment & Plan Note (Signed)

## 2017-09-20 ENCOUNTER — Other Ambulatory Visit: Payer: Self-pay | Admitting: Internal Medicine

## 2017-09-30 DIAGNOSIS — R6883 Chills (without fever): Secondary | ICD-10-CM | POA: Diagnosis not present

## 2017-09-30 DIAGNOSIS — J209 Acute bronchitis, unspecified: Secondary | ICD-10-CM | POA: Diagnosis not present

## 2017-10-27 ENCOUNTER — Ambulatory Visit: Payer: 59 | Admitting: Internal Medicine

## 2018-03-16 DIAGNOSIS — E0510 Thyrotoxicosis with toxic single thyroid nodule without thyrotoxic crisis or storm: Secondary | ICD-10-CM | POA: Diagnosis not present

## 2018-03-16 DIAGNOSIS — E890 Postprocedural hypothyroidism: Secondary | ICD-10-CM | POA: Diagnosis not present

## 2018-03-16 DIAGNOSIS — E7849 Other hyperlipidemia: Secondary | ICD-10-CM | POA: Diagnosis not present

## 2018-03-25 DIAGNOSIS — E890 Postprocedural hypothyroidism: Secondary | ICD-10-CM | POA: Diagnosis not present

## 2018-03-25 DIAGNOSIS — E049 Nontoxic goiter, unspecified: Secondary | ICD-10-CM | POA: Diagnosis not present

## 2018-03-25 DIAGNOSIS — E7800 Pure hypercholesterolemia, unspecified: Secondary | ICD-10-CM | POA: Diagnosis not present

## 2018-04-09 ENCOUNTER — Ambulatory Visit (INDEPENDENT_AMBULATORY_CARE_PROVIDER_SITE_OTHER)
Admission: RE | Admit: 2018-04-09 | Discharge: 2018-04-09 | Disposition: A | Discharge status: Home / Self Care | Payer: 59 | Source: Ambulatory Visit | Attending: Family Medicine | Admitting: Family Medicine

## 2018-04-09 ENCOUNTER — Encounter: Payer: Self-pay | Admitting: Family Medicine

## 2018-04-09 ENCOUNTER — Ambulatory Visit: Payer: Self-pay

## 2018-04-09 ENCOUNTER — Ambulatory Visit: Payer: 59 | Admitting: Family Medicine

## 2018-04-09 VITALS — BP 120/68 | HR 69 | Temp 97.8°F | Ht 64.0 in | Wt 149.0 lb

## 2018-04-09 DIAGNOSIS — F17200 Nicotine dependence, unspecified, uncomplicated: Secondary | ICD-10-CM

## 2018-04-09 DIAGNOSIS — R0789 Other chest pain: Secondary | ICD-10-CM

## 2018-04-09 DIAGNOSIS — J441 Chronic obstructive pulmonary disease with (acute) exacerbation: Secondary | ICD-10-CM | POA: Insufficient documentation

## 2018-04-09 DIAGNOSIS — J449 Chronic obstructive pulmonary disease, unspecified: Secondary | ICD-10-CM | POA: Diagnosis not present

## 2018-04-09 DIAGNOSIS — R05 Cough: Secondary | ICD-10-CM | POA: Diagnosis not present

## 2018-04-09 MED ORDER — AZITHROMYCIN 250 MG PO TABS: ORAL_TABLET | ORAL | 0 refills | Status: DC

## 2018-04-09 MED ORDER — METHOCARBAMOL 500 MG PO TABS: 500.0000 mg | ORAL_TABLET | Freq: Two times a day (BID) | ORAL | 0 refills | Status: DC | PRN

## 2018-04-09 MED ORDER — PREDNISONE 20 MG PO TABS: 40.0000 mg | ORAL_TABLET | Freq: Every day | ORAL | 0 refills | Status: DC

## 2018-04-09 NOTE — Progress Notes (Signed)
BP 120/68 (BP Location: Left Arm, Patient Position: Sitting, Cuff Size: Normal)   Pulse 69   Temp 97.8 F (36.6 C) (Oral)   Ht 5\' 4"  (1.626 m)   Wt 149 lb (67.6 kg)   LMP 06/16/2005   SpO2 97%   BMI 25.58 kg/m    CC: cough, chest pain Subjective:    Patient ID: Regina Ruiz, female    DOB: [birth year], 62 y.o.   MRN: [id number]  HPI: Regina Ruiz is a 62 y.o. female presenting on 04/09/2018 for Cough (C/o cough with clear mucous. Started 1 mo ago. Tried Mucinex, helpful. ) and Chest Pain (C/o chest pain for about 2 mos. States it feels like a bubble on either side of lower chest, then will go down. )   1+ mo h/o pains at lower chest bilaterally lower ribcage "feel like cramping muscle spasm" pressing on chest wall helps with this as does deep breathing. No palpitations, not exertional, not relieved by rest. Pain can be positional at times - ie leaning to floor while sitting. Aleve has helped.   1+ mo h/o cough productive of clear phlegm. No increase in sputum production. Some increased dyspnea and increased cough from normal COPD cough. Treating with mucinex cough and cold. PNDrainage. Some dyspnea with coughing fits. Some chest congestion.   No fevers/chills, ear or tooth pain, ST, HA, wheezing. No nasal congestion.  No sick contacts at home.  Current smoker 1 ppd.  COPD - on symbicort intermittently, using more regularly recently.   Relevant past medical, surgical, family and social history reviewed and updated as indicated. Interim medical history since our last visit reviewed. Allergies and medications reviewed and updated. Outpatient Medications Prior to Visit  Medication Sig Dispense Refill  . ALPRAZolam (XANAX) 0.25 MG tablet TAKE 1 TAB BY MOUTH 2 TIMES DAILY AS NEEDED 60 tablet 0  . benzonatate (TESSALON) 200 MG capsule Take 1 capsule (200 mg total) by mouth 3 (three) times daily as needed for cough (swallow whole). 30 capsule 5  . budesonide-formoterol (SYMBICORT)  160-4.5 MCG/ACT inhaler Inhale 2 puffs into the lungs 2 (two) times daily. 1 Inhaler 11  . levothyroxine (SYNTHROID, LEVOTHROID) 88 MCG tablet Take 88 mcg by mouth daily.      . Multiple Vitamins-Minerals (MULTIVITAMIN WITH MINERALS) tablet Take 1 tablet by mouth daily.    . SUMAtriptan (IMITREX) 100 MG tablet Take 1 pill orally for migraine - if necessary may repeat once in 2 hours 10 tablet 3  . Cholecalciferol (VITAMIN D) 400 UNITS capsule Take 400 Units by mouth daily.      . famotidine (PEPCID) 20 MG tablet One at bedtime 30 tablet 2  . pantoprazole (PROTONIX) 40 MG tablet TAKE 1 TABLET (40 MG TOTAL) BY MOUTH DAILY. TAKE 30-60 MIN BEFORE FIRST MEAL OF THE DAY 30 tablet 2   No facility-administered medications prior to visit.      Per HPI unless specifically indicated in ROS section below Review of Systems     Objective:    BP 120/68 (BP Location: Left Arm, Patient Position: Sitting, Cuff Size: Normal)   Pulse 69   Temp 97.8 F (36.6 C) (Oral)   Ht 5\' 4"  (1.626 m)   Wt 149 lb (67.6 kg)   LMP 06/16/2005   SpO2 97%   BMI 25.58 kg/m   Wt Readings from Last 3 Encounters:  04/09/18 149 lb (67.6 kg)  07/30/17 141 lb (64 kg)  06/29/17 142 lb (64.4 kg)    Physical  Exam  Constitutional: She appears well-developed and well-nourished. No distress.  HENT:  Head: Normocephalic and atraumatic.  Right Ear: Hearing, tympanic membrane, external ear and ear canal normal.  Left Ear: Hearing, tympanic membrane, external ear and ear canal normal.  Nose: No mucosal edema or rhinorrhea. Right sinus exhibits no maxillary sinus tenderness and no frontal sinus tenderness. Left sinus exhibits no maxillary sinus tenderness and no frontal sinus tenderness.  Mouth/Throat: Uvula is midline, oropharynx is clear and moist and mucous membranes are normal. No oropharyngeal exudate, posterior oropharyngeal edema, posterior oropharyngeal erythema or tonsillar abscesses.  Eyes: Pupils are equal, round, and  reactive to light. Conjunctivae and EOM are normal. No scleral icterus.  Neck: Normal range of motion. Neck supple.  Cardiovascular: Normal rate, regular rhythm, normal heart sounds and intact distal pulses.  No murmur heard. Pulmonary/Chest: Effort normal and breath sounds normal. No respiratory distress. She has no wheezes. She has no rales. She exhibits no tenderness (no reproducible tenderness).  Mildly coarse throughout  Lymphadenopathy:    She has no cervical adenopathy.  Skin: Skin is warm and dry. No rash noted.  Nursing note and vitals reviewed.     Assessment & Plan:   Problem List Items Addressed This Visit    COPD GOLD II still smoking     Otherwise continue regular symbicort use.       Relevant Medications   azithromycin (ZITHROMAX) 250 MG tablet   predniSONE (DELTASONE) 20 MG tablet   Other Relevant Orders   DG Chest 2 View   COPD exacerbation (HCC)    Increased dyspnea and cough endorsed, no increase in sputum production. Possible mild COPD flare - treat with prednisone course. WASP for zpack provided in case not improving with above. Pt agrees.       Relevant Medications   azithromycin (ZITHROMAX) 250 MG tablet   predniSONE (DELTASONE) 20 MG tablet   CIGARETTE SMOKER    Continued smoker - continue to encourage cessation.  Discussed she would benefit from lung cancer screening CT - she will touch base with PCP at next visit.       Relevant Orders   DG Chest 2 View   Chest wall pain - Primary    Not cardiac in nature. Check CXR. Possible pleurisy vs chest wall strain from cough. Rx prednisone course, robaxin muscle relaxant, heating pad. Update if not improving with treatment.       Relevant Orders   DG Chest 2 View       Meds ordered this encounter  Medications  . azithromycin (ZITHROMAX) 250 MG tablet    Sig: Take two tablets on day one followed by one tablet on days 2-5    Dispense:  6 each    Refill:  0  . predniSONE (DELTASONE) 20 MG tablet     Sig: Take 2 tablets (40 mg total) by mouth daily with breakfast.    Dispense:  10 tablet    Refill:  0  . methocarbamol (ROBAXIN) 500 MG tablet    Sig: Take 1 tablet (500 mg total) by mouth 2 (two) times daily as needed for muscle spasms (sedation precautions).    Dispense:  30 tablet    Refill:  0   Orders Placed This Encounter  Procedures  . DG Chest 2 View    Standing Status:   Future    Number of Occurrences:   1    Standing Expiration Date:   06/10/2019    Order Specific Question:  Reason for Exam (SYMPTOM  OR DIAGNOSIS REQUIRED)    Answer:   copd, cough,chest wall pain    Order Specific Question:   Is patient pregnant?    Answer:   No    Order Specific Question:   Preferred imaging location?    Answer:   [facility]    Order Specific Question:   Radiology Contrast Protocol - do NOT remove file path    Answer:   [email or web address]    Follow up plan: Return if symptoms worsen or fail to improve.  Ria Bush, MD

## 2018-04-09 NOTE — Assessment & Plan Note (Addendum)
Not cardiac in nature. Check CXR. Possible pleurisy vs chest wall strain from cough. Rx prednisone course, robaxin muscle relaxant, heating pad. Update if not improving with treatment.

## 2018-04-09 NOTE — Assessment & Plan Note (Addendum)
Continued smoker - continue to encourage cessation.  Discussed she would benefit from lung cancer screening CT - she will touch base with PCP at next visit.

## 2018-04-09 NOTE — Assessment & Plan Note (Addendum)
Otherwise continue regular symbicort use.

## 2018-04-09 NOTE — Assessment & Plan Note (Signed)
Increased dyspnea and cough endorsed, no increase in sputum production. Possible mild COPD flare - treat with prednisone course. WASP for zpack provided in case not improving with above. Pt agrees.

## 2018-04-09 NOTE — Patient Instructions (Addendum)
Xray today Prednisone course for possible COPD flare related pain. May try muscle relaxant at night time. Heating pad to chest wall. May restart aleve after finishing prednisone.  Fill zpack antibiotic if not improving with treatment.  Let us know if not improving with this.

## 2018-04-09 NOTE — Telephone Encounter (Signed)
Patient called in with c/o "chest pain." She says "this has been going on for about a month, but the past few days has gotten worse. The pain is across my breast underneath on both sides and feels like a muscle spasm. It feels like a bubble and is a 10, it stops me in my tracks, lasts for about 10-15 minutes, then eases and goes away. It doesn't happen all the time, but when it happens, it's several times a day. I'm not having pain now, but had it 15 minutes before I called." I asked about SOB, sweating, radiating pain, dizziness, fever, she denies all other symptoms. She says "I have a cough that has gotten real bad over the last couple of weeks. I've been taking OTC cough medicines to help with that." According to protocol, see PCP within 24 hours, no availability with PCP, appointment today at 1215 with Dr. Danise Mina, care advice given, patient verbalized understanding.   Reason for Disposition . [1] Chest pain lasting <= 5 minutes AND [2] NO chest pain or cardiac symptoms now(Exceptions: pains lasting a few seconds)  Answer Assessment - Initial Assessment Questions 1. LOCATION: "Where does it hurt?"       Underneath the breasts across on both sides 2. RADIATION: "Does the pain go anywhere else?" (e.g., into neck, jaw, arms, back)     No 3. ONSET: "When did the chest pain begin?" (Minutes, hours or days)      1 month ago 4. PATTERN "Does the pain come and go, or has it been constant since it started?"  "Does it get worse with exertion?"      Come and go-feels like a muscle spasm; sometimes several a day, then other days none 5. DURATION: "How long does it last" (e.g., seconds, minutes, hours)     It can last 10-15 minutes; heating pad helps ease it 6. SEVERITY: "How bad is the pain?"  (e.g., Scale 1-10; mild, moderate, or severe)    - MILD (1-3): doesn't interfere with normal activities     - MODERATE (4-7): interferes with normal activities or awakens from sleep    - SEVERE (8-10):  excruciating pain, unable to do any normal activities       10 when it hits-stop me in tracks; last time was 10-15 minutes ago, but not now.  7. CARDIAC RISK FACTORS: "Do you have any history of heart problems or risk factors for heart disease?" (e.g., prior heart attack, angina; high blood pressure, diabetes, being overweight, high cholesterol, smoking, or strong family history of heart disease)     No;smoker 8. PULMONARY RISK FACTORS: "Do you have any history of lung disease?"  (e.g., blood clots in lung, asthma, emphysema, birth control pills)     No 9. CAUSE: "What do you think is causing the chest pain?"     I don't know 10. OTHER SYMPTOMS: "Do you have any other symptoms?" (e.g., dizziness, nausea, vomiting, sweating, fever, difficulty breathing, cough)       Cough for last 2 weeks real bad 11. PREGNANCY: "Is there any chance you are pregnant?" "When was your last menstrual period?"       No  Protocols used: CHEST PAIN-A-AH

## 2018-04-28 ENCOUNTER — Ambulatory Visit: Payer: 59 | Admitting: Family Medicine

## 2018-04-28 ENCOUNTER — Encounter: Payer: Self-pay | Admitting: Family Medicine

## 2018-04-28 VITALS — BP 130/74 | HR 78 | Temp 97.9°F | Ht 64.0 in | Wt 151.5 lb

## 2018-04-28 DIAGNOSIS — F17200 Nicotine dependence, unspecified, uncomplicated: Secondary | ICD-10-CM

## 2018-04-28 DIAGNOSIS — F419 Anxiety disorder, unspecified: Secondary | ICD-10-CM | POA: Diagnosis not present

## 2018-04-28 DIAGNOSIS — J441 Chronic obstructive pulmonary disease with (acute) exacerbation: Secondary | ICD-10-CM

## 2018-04-28 MED ORDER — PREDNISONE 10 MG PO TABS: ORAL_TABLET | ORAL | 0 refills | Status: DC

## 2018-04-28 MED ORDER — ALPRAZOLAM 0.25 MG PO TABS: ORAL_TABLET | ORAL | 0 refills | Status: DC

## 2018-04-28 NOTE — Progress Notes (Signed)
Subjective:    Patient ID: Regina Ruiz, female    DOB: 1955/09/28, 62 y.o.   MRN: [id number]  HPI Here for c/o ST and congestion  2 weeks Just not fully getting better  Took zpak after visit at end of oct  Got better and then worse again   Now hoarse  ST - scratchy  pnd and glob in throat  Cough - prod of clear sputum  Some wheezing   Has symbicort - has started back  Took prednisone after last visit    No facial pain  A little facial pressure  No fever   Muscle spasms in chest - improved/took xanax    Smoking -about 1ppd  Trying to prep for smoking cessation-not ready to quit   Dg Chest 2 View  Result Date: 04/09/2018 CLINICAL DATA:  COPD with cough EXAM: CHEST - 2 VIEW COMPARISON:  07/30/2017, 06/02/2017, CT chest 06/20/2003 FINDINGS: Emphysematous disease. Diffuse reticular opacity. Stable scarring in the bilateral lung apices. No acute opacity or pleural effusion. Normal heart size. No pneumothorax. IMPRESSION: No active cardiopulmonary disease. Emphysematous disease and stable diffuse reticular changes. Scarring at the apices. Electronically Signed   By: Donavan Foil M.D.   On: 04/09/2018 20:27   Zyrtec did not help  Using robitussin for cough otc  Has tessalon pearles   Patient Active Problem List   Diagnosis Date Noted  . Chest wall pain 04/09/2018  . COPD exacerbation ([place]) 04/09/2018  . Postinflammatory pulmonary fibrosis ([place]) 06/29/2017  . Upper airway cough syndrome 06/29/2017  . Smokers' cough ([place]) 02/26/2016  . Encounter for routine gynecological examination 02/26/2016  . Nocturnal leg cramps 07/12/2013  . Unspecified gastritis and gastroduodenitis without mention of hemorrhage 10/04/2012  . Routine gynecological examination 12/16/2011  . Sleep disorder 03/21/2011  . Hypothyroid 02/19/2011  . Routine general medical examination at a health care facility 10/31/2010  . Vitamin D deficiency 12/06/2009  . ALLERGIC CONJUNCTIVITIS 10/23/2009  .  PULMONARY NODULE 08/30/2008  . CIGARETTE SMOKER 08/25/2008  . Anxiety 09/01/2006  . COPD GOLD II still smoking  09/01/2006  . Disorder of bone and cartilage 04/16/2006   Past Medical History:  Diagnosis Date  . Anxiety   . Colitis   . COPD (chronic obstructive pulmonary disease) ([place])   . Depression   . Osteopenia   . Thyroid disease    Past Surgical History:  Procedure Laterality Date  . DILATION AND CURETTAGE OF UTERUS    . endomet polyp  6/12   removed   . ENDOMETRIAL BIOPSY  6/12   Dr Phineas Real- negative  . HYSTEROSCOPY  6.7.12   [facility]: ENDOMETRIAL POLYP   Social History   Tobacco Use  . Smoking status: Current Every Day Smoker    Packs/day: 1.00    Years: 46.00    Pack years: 46.00    Types: Cigarettes  . Smokeless tobacco: Never Used  Substance Use Topics  . Alcohol use: Yes    Alcohol/week: 0.0 standard drinks    Comment: rare  . Drug use: No   Family History  Problem Relation Age of Onset  . Cancer Father        lung cancer and brain cancer  . Breast cancer Sister   . Stroke Maternal Grandfather   . Stroke Paternal Grandfather   . Heart disease Mother    Allergies  Allergen Reactions  . Butalbital-Aspirin-Caffeine     REACTION: unspecified  . Fosamax [Alendronate Sodium]     GI  . Meperidine  Hcl     REACTION: unspecified  . Methocarbamol Other (See Comments)    Causes Headaches/ Migraines  . Zolpidem Tartrate    Current Outpatient Medications on File Prior to Visit  Medication Sig Dispense Refill  . budesonide-formoterol (SYMBICORT) 160-4.5 MCG/ACT inhaler Inhale 2 puffs into the lungs 2 (two) times daily. 1 Inhaler 11  . levothyroxine (SYNTHROID, LEVOTHROID) 88 MCG tablet Take 88 mcg by mouth daily.      . Multiple Vitamins-Minerals (MULTIVITAMIN WITH MINERALS) tablet Take 1 tablet by mouth daily.    . SUMAtriptan (IMITREX) 100 MG tablet Take 1 pill orally for migraine - if necessary may repeat once in 2 hours 10 tablet 3   No current  facility-administered medications on file prior to visit.     Review of Systems  Constitutional: Positive for appetite change and fatigue. Negative for fever.  HENT: Positive for congestion, postnasal drip, rhinorrhea, sore throat and voice change. Negative for ear pain, sinus pressure and sneezing.   Eyes: Negative for pain and discharge.  Respiratory: Positive for cough, chest tightness and wheezing. Negative for shortness of breath and stridor.   Cardiovascular: Negative for chest pain.  Gastrointestinal: Negative for diarrhea, nausea and vomiting.  Genitourinary: Negative for frequency, hematuria and urgency.  Musculoskeletal: Negative for arthralgias and myalgias.  Skin: Negative for rash.  Neurological: Positive for headaches. Negative for dizziness, weakness and light-headedness.  Psychiatric/Behavioral: Negative for confusion and dysphoric mood.       Objective:   Physical Exam  Constitutional: She appears well-developed and well-nourished. No distress.  Well appearing   HENT:  Head: Normocephalic and atraumatic.  Right Ear: External ear normal.  Left Ear: External ear normal.  Mouth/Throat: Oropharynx is clear and moist.  Nares are injected and congested  No sinus tenderness Clear rhinorrhea and post nasal drip  Poor dentition   Eyes: Pupils are equal, round, and reactive to light. Conjunctivae and EOM are normal. Right eye exhibits no discharge. Left eye exhibits no discharge. No scleral icterus.  Neck: Normal range of motion. Neck supple.  Cardiovascular: Normal rate and normal heart sounds.  Pulmonary/Chest: Effort normal. No stridor. No respiratory distress. She has wheezes. She has no rales. She exhibits no tenderness.  Diffusely distant bs Scant end exp wheezes -worse with forced exp Few scattered rhonchi  Lymphadenopathy:    She has no cervical adenopathy.  Neurological: She is alert. No cranial nerve deficit.  Skin: Skin is warm and dry. No rash noted. No  pallor.  Psychiatric: She has a normal mood and affect.  Pleasant           Assessment & Plan:   Problem List Items Addressed This Visit      Respiratory   COPD exacerbation ([place]) - Primary    No suspected bacterial infection -re assuring exam  S/p tx with azithromycin Started back on symbicort Prednisone taper 30 mg / 9d px -disc side eff Enc strongly to cut back and quit smoking also  Due for f/u with pulmonary-she will re schedule  Disc symptomatic care - see instructions on AVS  Update if not starting to improve in a week or if worsening        Relevant Medications   predniSONE (DELTASONE) 10 MG tablet     Other   Anxiety    Xanax refilled with disc of sedation/habit potential rev       Relevant Medications   ALPRAZolam (XANAX) 0.25 MG tablet   CIGARETTE SMOKER    Disc  in detail risks of smoking and possible outcomes including copd, vascular/ heart disease, cancer , respiratory and sinus infections  Pt voices understanding   She is not ready to quit despite copd/ copd exacerbation  Enc her to consider  May be open to trying chantix again in the future

## 2018-04-28 NOTE — Patient Instructions (Addendum)
Call and get your pulmonary appointment   Continue symbicort  Try albuterol inhaler as needed for wheezing and tight chest  Let's try a prednisone taper  Continue robitussin for cough and try tessalon also   Do not overuse xanax    Magnesium helps muscle cramps - 250 to 500 mg daily with a meal   Always rinse mouth with water after using symbicort  You can get thrush from that and also prednisone   Keep thinking about quitting smoking

## 2018-04-28 NOTE — Assessment & Plan Note (Signed)
Disc in detail risks of smoking and possible outcomes including copd, vascular/ heart disease, cancer , respiratory and sinus infections  Pt voices understanding   She is not ready to quit despite copd/ copd exacerbation  Enc her to consider  May be open to trying chantix again in the future

## 2018-04-28 NOTE — Assessment & Plan Note (Signed)
Xanax refilled with disc of sedation/habit potential rev

## 2018-04-28 NOTE — Assessment & Plan Note (Signed)
No suspected bacterial infection -re assuring exam  S/p tx with azithromycin Started back on symbicort Prednisone taper 30 mg / 9d px -disc side eff Enc strongly to cut back and quit smoking also  Due for f/u with pulmonary-she will re schedule  Disc symptomatic care - see instructions on AVS  Update if not starting to improve in a week or if worsening

## 2018-06-21 ENCOUNTER — Ambulatory Visit: Payer: Self-pay

## 2018-06-21 NOTE — Telephone Encounter (Addendum)
Pt called back and is upset because she cannot get appt with Dr Glori Bickers and pt said the last time she had any symptoms was on 06/19/2018. Pt has no symptoms now. I spoke with Leafy Ro RN clinical mgr and she said if pt is not having any symptoms pt could be scheduled to see Dr Glori Bickers 06/22/18 and if pt were to develop any CP, SOB, dizziness or any new symptoms prior to appt pt would go to ED for eval. Pt voiced understanding and agreed any CP dizziness,SOB or other symptoms would go to ED prior to appt. Pt scheduled appt with Dr Glori Bickers 06/22/18 at 12:30 per [place] CMA. Pt apologized for being upset and I told her there was no need to apologize we just wanted to make sure she is taken care of. Pt was appreciative and will see Dr Glori Bickers tomorrow.FYI to Dr Glori Bickers.

## 2018-06-21 NOTE — Telephone Encounter (Addendum)
Unable to reach pt by any contact # and I spoke with Mardene Celeste (DPR signed) and she will have pt call [facility]. Was trying to find out last time pt had CP.

## 2018-06-21 NOTE — Telephone Encounter (Signed)
Pt called stating that she was seen a couple months ago for a chest pain she was experiencing.  She states the doctor felt maybe a pulled muscle.  She has continued to have the chest pain that she says stays in her mid chest. It will travel to the right or left but denies that the pain travel's to her back arm shoulder jaw. She state it often occurs after eating.  She states that pain is a 10 and it makes it difficult to breath.  She is sometimes dizzy. The pain last from just a few minutes to 10 minutes.Per protocol pt should go to the Ed for evaluation. Pt refuses stating when the symptoms come again she will go. Care advice read to patient. Pt verbalized understanding of all instructions.  Reason for Disposition . SEVERE chest pain  Answer Assessment - Initial Assessment Questions 1. LOCATION: "Where does it hurt?"       Mid chest 2. RADIATION: "Does the pain go anywhere else?" (e.g., into neck, jaw, arms, back)     No back and forth in chest area 3. ONSET: "When did the chest pain begin?" (Minutes, hours or days)      Started 2 months ago 4. PATTERN "Does the pain come and go, or has it been constant since it started?"  "Does it get worse with exertion?"      Comes and goes 5. DURATION: "How long does it last" (e.g., seconds, minutes, hours)     A few minutes to 10 minutes 6. SEVERITY: "How bad is the pain?"  (e.g., Scale 1-10; mild, moderate, or severe)    - MILD (1-3): doesn't interfere with normal activities     - MODERATE (4-7): interferes with normal activities or awakens from sleep    - SEVERE (8-10): excruciating pain, unable to do any normal activities       10 7. CARDIAC RISK FACTORS: "Do you have any history of heart problems or risk factors for heart disease?" (e.g., prior heart attack, angina; high blood pressure, diabetes, being overweight, high cholesterol, smoking, or strong family history of heart disease)     smoker 8. PULMONARY RISK FACTORS: "Do you have any history of  lung disease?"  (e.g., blood clots in lung, asthma, emphysema, birth control pills)    emphysema COPD 9. CAUSE: "What do you think is causing the chest pain?"     After eating sometimes 10. OTHER SYMPTOMS: "Do you have any other symptoms?" (e.g., dizziness, nausea, vomiting, sweating, fever, difficulty breathing, cough)      Difficult to breath and lightheaded 11. PREGNANCY: "Is there any chance you are pregnant?" "When was your last menstrual period?"     N/A  Protocols used: CHEST PAIN-A-AH

## 2018-06-21 NOTE — Telephone Encounter (Signed)
I will see her then  

## 2018-06-22 ENCOUNTER — Encounter: Payer: Self-pay | Admitting: Family Medicine

## 2018-06-22 ENCOUNTER — Ambulatory Visit: Payer: 59 | Admitting: Family Medicine

## 2018-06-22 VITALS — BP 124/68 | HR 74 | Temp 97.6°F | Ht 64.0 in | Wt 153.0 lb

## 2018-06-22 DIAGNOSIS — R109 Unspecified abdominal pain: Secondary | ICD-10-CM | POA: Diagnosis not present

## 2018-06-22 LAB — CBC WITH DIFFERENTIAL/PLATELET
BASOS PCT: 0.7 % (ref 0.0–3.0)
Basophils Absolute: 0.1 10*3/uL (ref 0.0–0.1)
EOS PCT: 1.1 % (ref 0.0–5.0)
Eosinophils Absolute: 0.1 10*3/uL (ref 0.0–0.7)
HEMATOCRIT: 44.7 % (ref 36.0–46.0)
HEMOGLOBIN: 15.1 g/dL — AB (ref 12.0–15.0)
LYMPHS PCT: 25.2 % (ref 12.0–46.0)
Lymphs Abs: 2.1 10*3/uL (ref 0.7–4.0)
MCHC: 33.8 g/dL (ref 30.0–36.0)
MCV: 92.3 fl (ref 78.0–100.0)
Monocytes Absolute: 0.8 10*3/uL (ref 0.1–1.0)
Monocytes Relative: 9.6 % (ref 3.0–12.0)
NEUTROS ABS: 5.2 10*3/uL (ref 1.4–7.7)
Neutrophils Relative %: 63.4 % (ref 43.0–77.0)
Platelets: 288 10*3/uL (ref 150.0–400.0)
RBC: 4.84 Mil/uL (ref 3.87–5.11)
RDW: 13 % (ref 11.5–15.5)
WBC: 8.2 10*3/uL (ref 4.0–10.5)

## 2018-06-22 LAB — BASIC METABOLIC PANEL
BUN: 10 mg/dL (ref 6–23)
CHLORIDE: 104 meq/L (ref 96–112)
CO2: 32 meq/L (ref 19–32)
Calcium: 9.1 mg/dL (ref 8.4–10.5)
Creatinine, Ser: 0.86 mg/dL (ref 0.40–1.20)
GFR: 71.29 mL/min (ref >60.00)
Glucose, Bld: 67 mg/dL — ABNORMAL LOW (ref 70–99)
POTASSIUM: 3.9 meq/L (ref 3.5–5.1)
Sodium: 140 mEq/L (ref 135–145)

## 2018-06-22 LAB — HEPATIC FUNCTION PANEL
ALBUMIN: 4.3 g/dL (ref 3.5–5.2)
ALT: 13 U/L (ref 0–35)
AST: 15 U/L (ref 0–37)
Alkaline Phosphatase: 123 U/L — ABNORMAL HIGH (ref 39–117)
Bilirubin, Direct: 0.1 mg/dL (ref 0.0–0.3)
TOTAL PROTEIN: 6.7 g/dL (ref 6.0–8.3)
Total Bilirubin: 0.3 mg/dL (ref 0.2–1.2)

## 2018-06-22 LAB — LIPASE: LIPASE: 17 U/L (ref 11.0–59.0)

## 2018-06-22 MED ORDER — ALBUTEROL SULFATE HFA 108 (90 BASE) MCG/ACT IN AERS: 2.0000 | INHALATION_SPRAY | RESPIRATORY_TRACT | 5 refills | Status: DC | PRN

## 2018-06-22 NOTE — Patient Instructions (Signed)
Avoid fatty foods as much as you can   If pain suddenly worsens-let us know   Drink lots of water More fruits and veggies   We will refer you for an abdominal ultrasound   Also labs today   Plan to follow

## 2018-06-22 NOTE — Assessment & Plan Note (Signed)
Worse after fatty food  Gas/bloating and belching  Some nausea  [place] abd ordered to look at liver and gb Lab today incl lft and lipase  Will update  Disc bland diet/avoid fatty foods  H2 blocker only if helpful

## 2018-06-22 NOTE — Progress Notes (Signed)
Subjective:    Patient ID: Regina Ruiz, female    DOB: 07/25/1955, 63 y.o.   MRN: [id number]  HPI Here with stomach symptoms   Mentioned sob and dizziness when she called- states this was incorrect   Had a few episodes on Sat Also new years eve (steak)  Full of air and bloated  Had eaten bacon and eggs   occ happens w/o eating   BMs -usually daily  No having frequent stools with urgency  Sometimes loose-not always  Saw scant blood on tissue occas   A little nausea No vomiting   A little cough - clear mucous/ smokers cough  No heartburn  occ wheezing    Wants to check out her gallbladder   Right sided abd soreness/pain  -just bloating and gas  A lot of belching   Zantac-helped just a little in the past   Working on smoking cessation-has cut back   Eats fruit and vegetables - ? Not enough    Patient Active Problem List   Diagnosis Date Noted  . Right sided abdominal pain 06/22/2018  . Chest wall pain 04/09/2018  . COPD exacerbation ([place]) 04/09/2018  . Postinflammatory pulmonary fibrosis ([place]) 06/29/2017  . Upper airway cough syndrome 06/29/2017  . Smokers' cough ([place]) 02/26/2016  . Encounter for routine gynecological examination 02/26/2016  . Nocturnal leg cramps 07/12/2013  . Unspecified gastritis and gastroduodenitis without mention of hemorrhage 10/04/2012  . Routine gynecological examination 12/16/2011  . Sleep disorder 03/21/2011  . Hypothyroid 02/19/2011  . Routine general medical examination at a health care facility 10/31/2010  . Vitamin D deficiency 12/06/2009  . ALLERGIC CONJUNCTIVITIS 10/23/2009  . PULMONARY NODULE 08/30/2008  . CIGARETTE SMOKER 08/25/2008  . Anxiety 09/01/2006  . COPD GOLD II still smoking  09/01/2006  . Disorder of bone and cartilage 04/16/2006   Past Medical History:  Diagnosis Date  . Anxiety   . Colitis   . COPD (chronic obstructive pulmonary disease) ([place])   . Depression   . Osteopenia   . Thyroid disease     Past Surgical History:  Procedure Laterality Date  . DILATION AND CURETTAGE OF UTERUS    . endomet polyp  6/12   removed   . ENDOMETRIAL BIOPSY  6/12   Dr Phineas Real- negative  . HYSTEROSCOPY  6.7.12   [facility]: ENDOMETRIAL POLYP   Social History   Tobacco Use  . Smoking status: Current Every Day Smoker    Packs/day: 1.00    Years: 46.00    Pack years: 46.00    Types: Cigarettes  . Smokeless tobacco: Never Used  Substance Use Topics  . Alcohol use: Yes    Alcohol/week: 0.0 standard drinks    Comment: rare  . Drug use: No   Family History  Problem Relation Age of Onset  . Cancer Father        lung cancer and brain cancer  . Breast cancer Sister   . Stroke Maternal Grandfather   . Stroke Paternal Grandfather   . Heart disease Mother    Allergies  Allergen Reactions  . Butalbital-Aspirin-Caffeine     REACTION: unspecified  . Fosamax [Alendronate Sodium]     GI  . Meperidine Hcl     REACTION: unspecified  . Methocarbamol Other (See Comments)    Causes Headaches/ Migraines  . Zolpidem Tartrate    Current Outpatient Medications on File Prior to Visit  Medication Sig Dispense Refill  . ALPRAZolam (XANAX) 0.25 MG tablet TAKE 1 TAB BY MOUTH  2 TIMES DAILY AS NEEDED 60 tablet 0  . budesonide-formoterol (SYMBICORT) 160-4.5 MCG/ACT inhaler Inhale 2 puffs into the lungs 2 (two) times daily. 1 Inhaler 11  . levothyroxine (SYNTHROID, LEVOTHROID) 88 MCG tablet Take 88 mcg by mouth daily.      . Multiple Vitamins-Minerals (MULTIVITAMIN WITH MINERALS) tablet Take 1 tablet by mouth daily.    . SUMAtriptan (IMITREX) 100 MG tablet Take 1 pill orally for migraine - if necessary may repeat once in 2 hours 10 tablet 3   No current facility-administered medications on file prior to visit.     Review of Systems  Constitutional: Negative for activity change, appetite change, fatigue, fever and unexpected weight change.  HENT: Negative for congestion, ear pain, rhinorrhea, sinus pressure  and sore throat.   Eyes: Negative for pain, redness and visual disturbance.  Respiratory: Negative for cough, shortness of breath and wheezing.   Cardiovascular: Negative for chest pain and palpitations.  Gastrointestinal: Positive for abdominal distention, abdominal pain and nausea. Negative for anal bleeding, blood in stool, constipation, diarrhea, rectal pain and vomiting.  Endocrine: Negative for polydipsia and polyuria.  Genitourinary: Negative for dysuria, frequency and urgency.  Musculoskeletal: Negative for arthralgias, back pain and myalgias.  Skin: Negative for pallor and rash.  Allergic/Immunologic: Negative for environmental allergies.  Neurological: Negative for dizziness, syncope and headaches.  Hematological: Negative for adenopathy. Does not bruise/bleed easily.  Psychiatric/Behavioral: Negative for decreased concentration and dysphoric mood. The patient is not nervous/anxious.        Objective:   Physical Exam Constitutional:      General: She is not in acute distress.    Appearance: She is well-developed and normal weight. She is not ill-appearing or diaphoretic.  HENT:     Head: Normocephalic and atraumatic.     Mouth/Throat:     Mouth: Mucous membranes are moist.     Pharynx: Oropharynx is clear.  Eyes:     General: No scleral icterus.    Extraocular Movements: Extraocular movements intact.  Cardiovascular:     Rate and Rhythm: Normal rate and regular rhythm.     Heart sounds: Normal heart sounds.  Pulmonary:     Effort: Pulmonary effort is normal.     Breath sounds: Normal breath sounds. No stridor. No wheezing, rhonchi or rales.     Comments: Diffusely distant bs  Abdominal:     General: Abdomen is flat. Bowel sounds are normal. There is no distension or abdominal bruit.     Palpations: Abdomen is soft. There is no hepatomegaly, mass or pulsatile mass.     Tenderness: There is abdominal tenderness in the right upper quadrant and epigastric area. There is  no right CVA tenderness, left CVA tenderness, guarding or rebound. Positive signs include Murphy's sign. Negative signs include McBurney's sign.     Hernia: No hernia is present.  Skin:    General: Skin is warm and dry.     Coloration: Skin is not jaundiced or pale.     Findings: No rash.  Neurological:     General: No focal deficit present.     Mental Status: She is alert.  Psychiatric:        Mood and Affect: Mood normal.           Assessment & Plan:   Problem List Items Addressed This Visit      Other   Right sided abdominal pain - Primary    Worse after fatty food  Gas/bloating and belching  Some nausea  [place] abd ordered to look at liver and gb Lab today incl lft and lipase  Will update  Disc bland diet/avoid fatty foods  H2 blocker only if helpful       Relevant Orders   US Abdomen Complete   Basic metabolic panel   CBC with Differential/Platelet   Hepatic function panel   Lipase

## 2018-06-24 ENCOUNTER — Telehealth: Payer: Self-pay | Admitting: *Deleted

## 2018-06-24 IMAGING — DX DG CHEST 2V
2 series · 2 of 2 positions shown · non-contrast
Comparison: 06/02/2017

CLINICAL DATA: COPD

EXAM:
CHEST  2 VIEW

[chest pa]
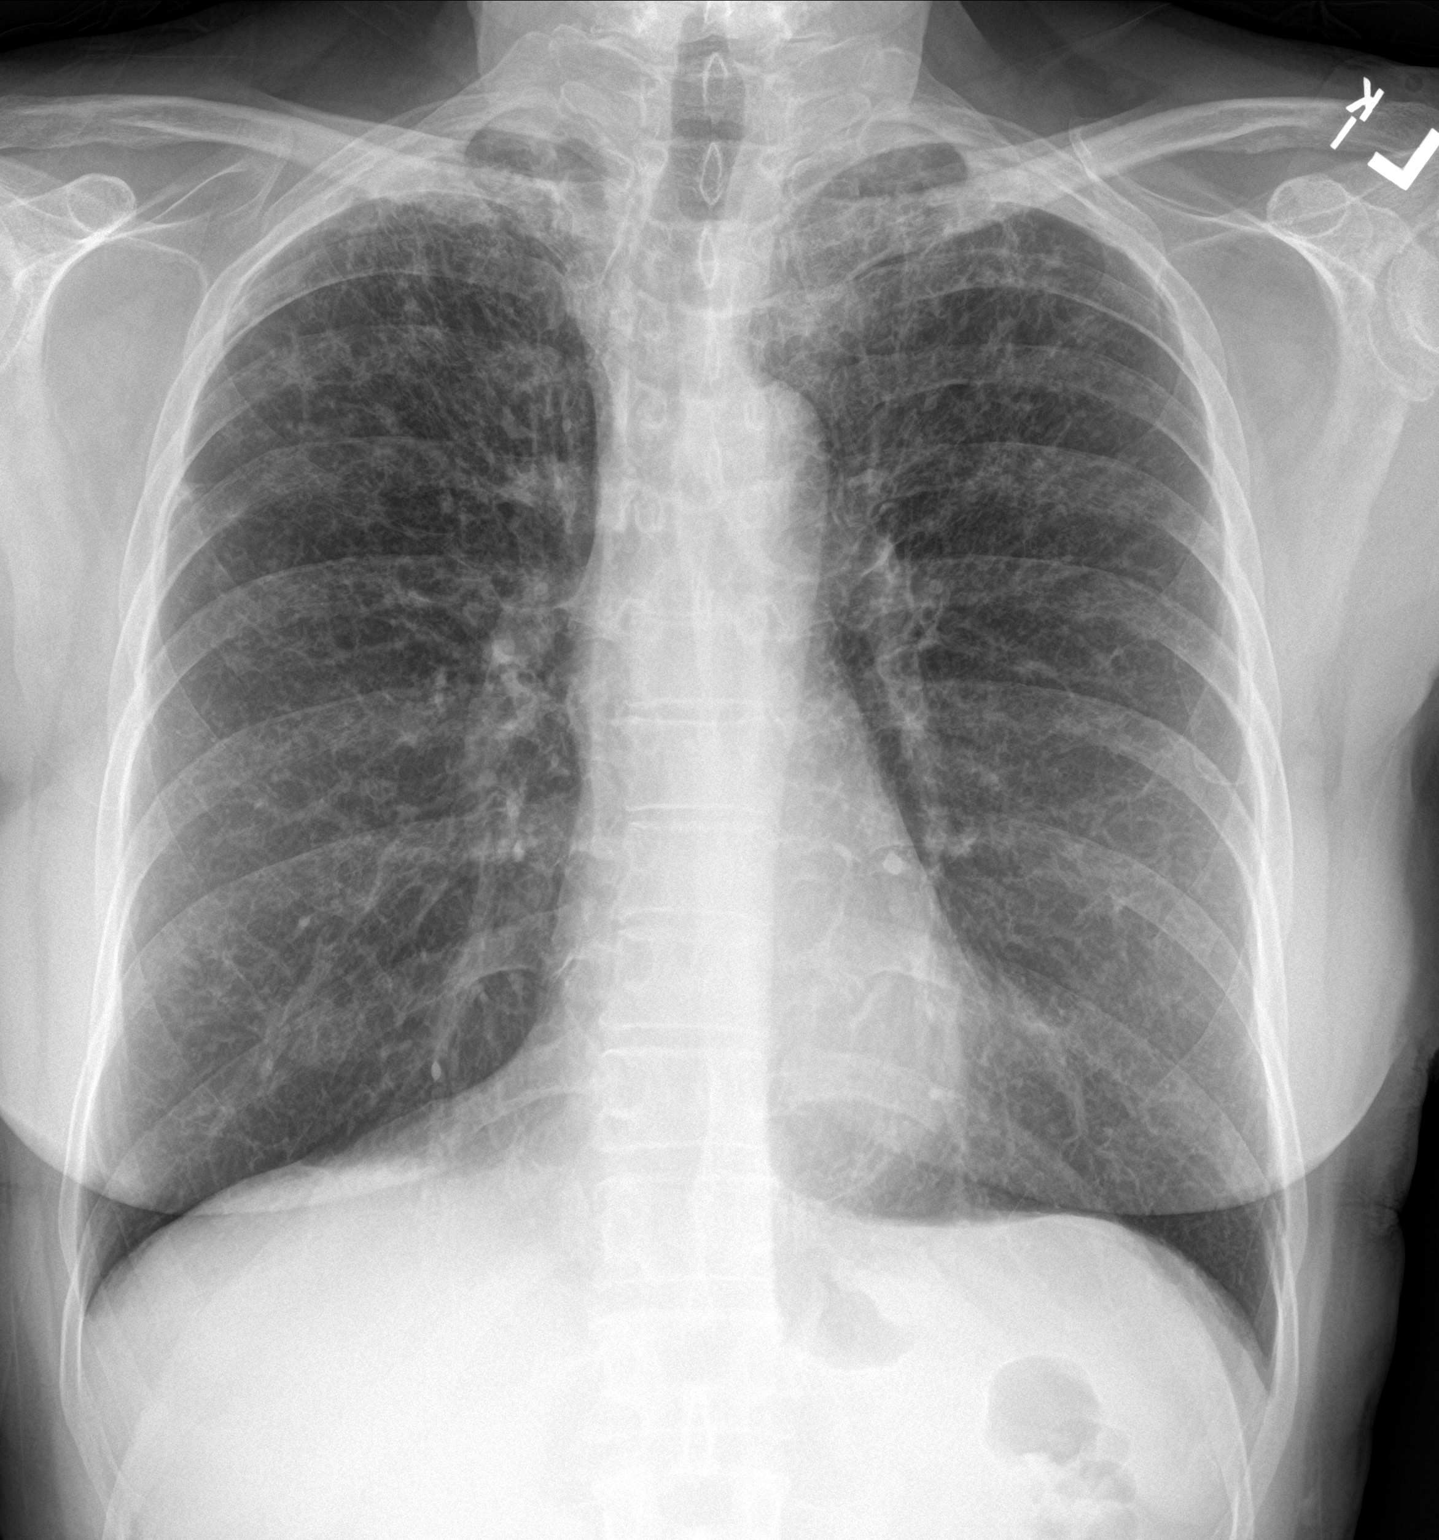

[chest lat]
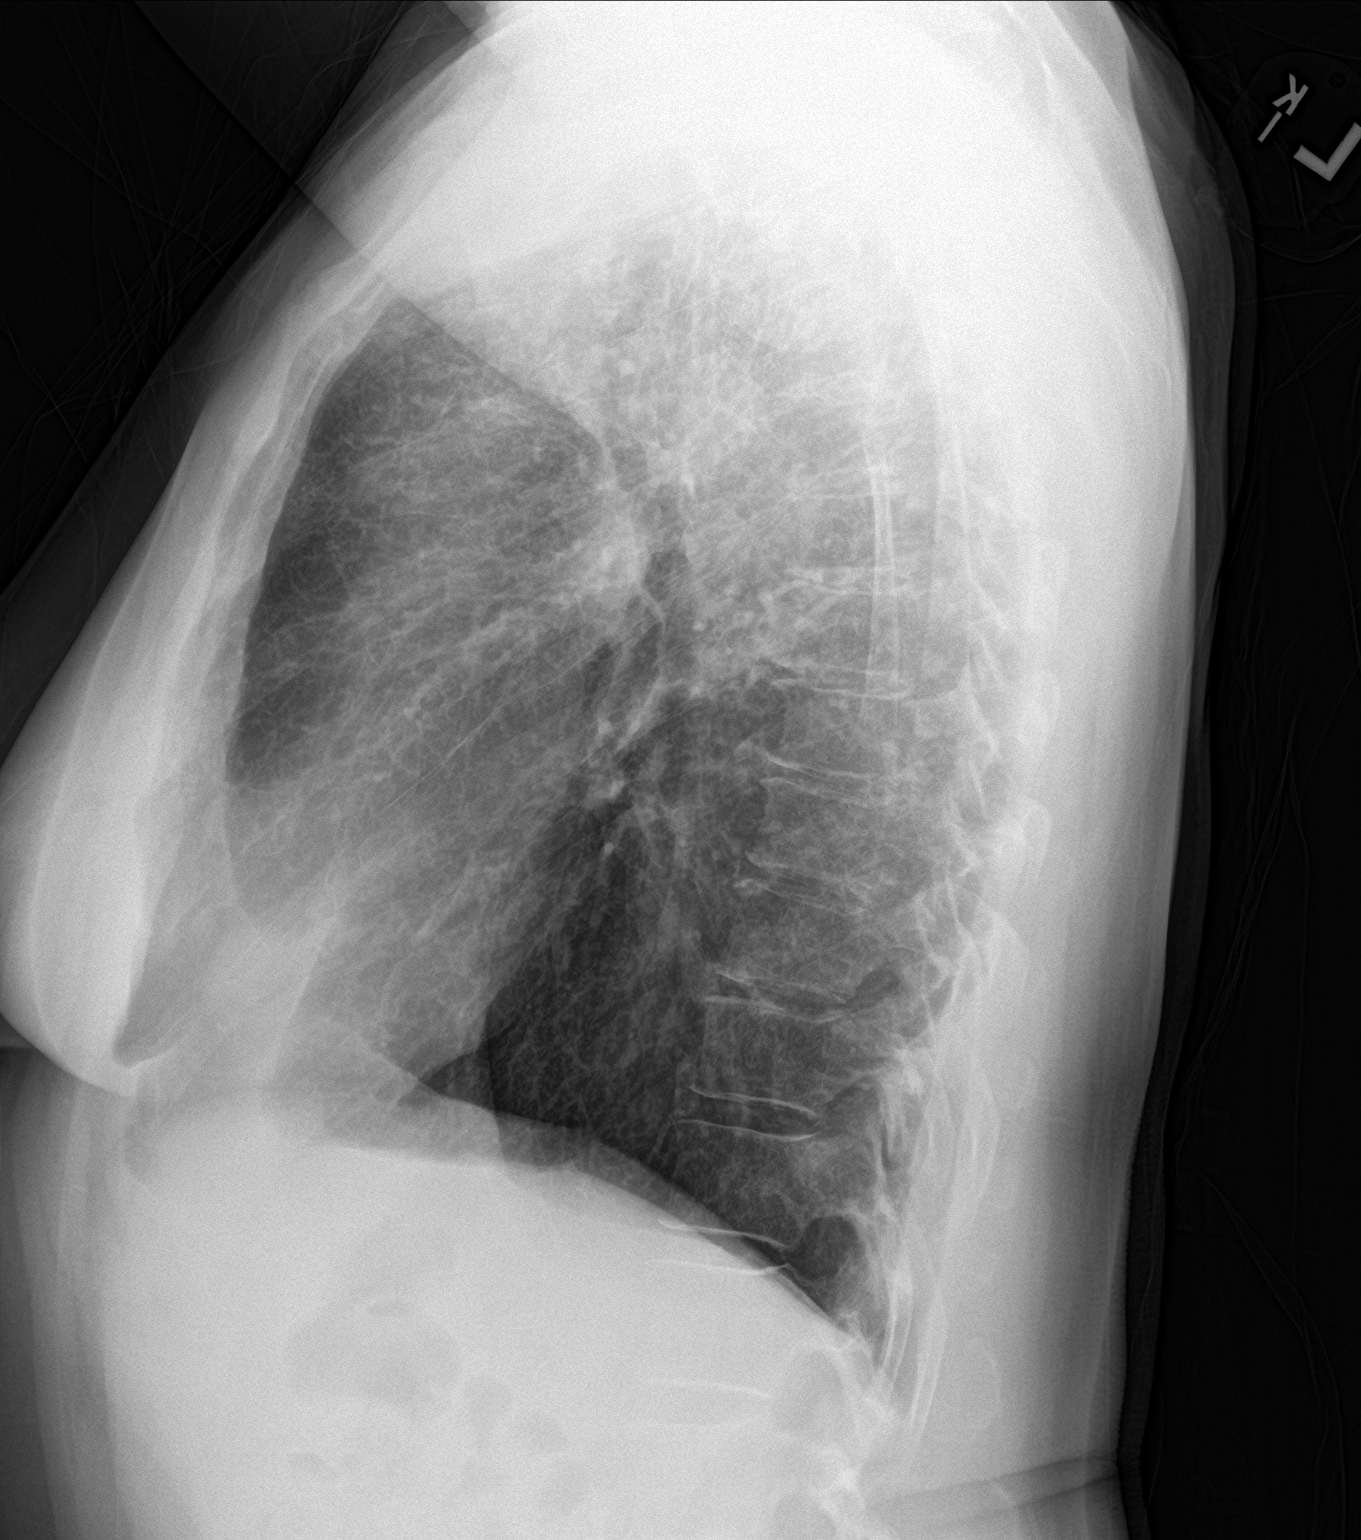

[2 of 2 positions shown; findings below may reference images not displayed]

FINDINGS: Interstitial prominence and reticulonodular densities most
pronounced in the upper lung zones compatible with
scarring/fibrosis. This is unchanged since prior study. Heart is
normal size. Mild hyperinflation. No acute confluent airspace
opacities or effusions. No acute bony abnormality.
IMPRESSION: COPD.

Stable interstitial and reticulonodular changes in the upper lobes.

No active disease.

## 2018-06-24 NOTE — Telephone Encounter (Signed)
Left VM requesting pt to call the office back regarding lab results  

## 2018-06-25 ENCOUNTER — Ambulatory Visit
Admission: RE | Admit: 2018-06-25 | Discharge: 2018-06-25 | Disposition: A | Discharge status: Home / Self Care | Payer: 59 | Source: Ambulatory Visit | Attending: Family Medicine | Admitting: Family Medicine

## 2018-06-25 ENCOUNTER — Telehealth: Payer: Self-pay

## 2018-06-25 DIAGNOSIS — R109 Unspecified abdominal pain: Secondary | ICD-10-CM

## 2018-06-25 DIAGNOSIS — K828 Other specified diseases of gallbladder: Secondary | ICD-10-CM | POA: Diagnosis not present

## 2018-06-25 NOTE — Telephone Encounter (Signed)
Stacy with [place] called report [place] of [place]. Copy of report taken to Dr Glori Bickers by Rexene Agent CMA and report is in [place].

## 2018-06-25 NOTE — Telephone Encounter (Signed)
Addressed in result notes  

## 2018-06-25 NOTE — Telephone Encounter (Signed)
Aware-I resulted it

## 2018-07-04 ENCOUNTER — Emergency Department (HOSPITAL_BASED_OUTPATIENT_CLINIC_OR_DEPARTMENT_OTHER)
Admission: EM | Admit: 2018-07-04 | Discharge: 2018-07-04 | Disposition: A | Discharge status: Home / Self Care | Payer: 59 | Attending: Emergency Medicine | Admitting: Emergency Medicine

## 2018-07-04 ENCOUNTER — Emergency Department (HOSPITAL_BASED_OUTPATIENT_CLINIC_OR_DEPARTMENT_OTHER): Payer: 59

## 2018-07-04 ENCOUNTER — Other Ambulatory Visit: Payer: Self-pay

## 2018-07-04 ENCOUNTER — Encounter (HOSPITAL_BASED_OUTPATIENT_CLINIC_OR_DEPARTMENT_OTHER): Payer: Self-pay | Admitting: Emergency Medicine

## 2018-07-04 DIAGNOSIS — J449 Chronic obstructive pulmonary disease, unspecified: Secondary | ICD-10-CM | POA: Diagnosis not present

## 2018-07-04 DIAGNOSIS — R110 Nausea: Secondary | ICD-10-CM | POA: Diagnosis not present

## 2018-07-04 DIAGNOSIS — R1010 Upper abdominal pain, unspecified: Secondary | ICD-10-CM

## 2018-07-04 DIAGNOSIS — R1012 Left upper quadrant pain: Secondary | ICD-10-CM | POA: Insufficient documentation

## 2018-07-04 DIAGNOSIS — F17210 Nicotine dependence, cigarettes, uncomplicated: Secondary | ICD-10-CM | POA: Diagnosis not present

## 2018-07-04 DIAGNOSIS — Z79899 Other long term (current) drug therapy: Secondary | ICD-10-CM | POA: Diagnosis not present

## 2018-07-04 DIAGNOSIS — K5730 Diverticulosis of large intestine without perforation or abscess without bleeding: Secondary | ICD-10-CM | POA: Diagnosis not present

## 2018-07-04 LAB — CBC WITH DIFFERENTIAL/PLATELET
Abs Immature Granulocytes: 0.02 10*3/uL (ref 0.00–0.07)
Basophils Absolute: 0.1 10*3/uL (ref 0.0–0.1)
Basophils Relative: 1 %
Eosinophils Absolute: 0.1 10*3/uL (ref 0.0–0.5)
Eosinophils Relative: 1 %
HCT: 47.3 % — ABNORMAL HIGH (ref 36.0–46.0)
Hemoglobin: 15.5 g/dL — ABNORMAL HIGH (ref 12.0–15.0)
IMMATURE GRANULOCYTES: 0 %
Lymphocytes Relative: 23 %
Lymphs Abs: 2.2 10*3/uL (ref 0.7–4.0)
MCH: 29.9 pg (ref 26.0–34.0)
MCHC: 32.8 g/dL (ref 30.0–36.0)
MCV: 91.1 fL (ref 80.0–100.0)
Monocytes Absolute: 0.7 10*3/uL (ref 0.1–1.0)
Monocytes Relative: 7 %
NEUTROS PCT: 68 %
Neutro Abs: 6.4 10*3/uL (ref 1.7–7.7)
Platelets: 318 10*3/uL (ref 150–400)
RBC: 5.19 MIL/uL — AB (ref 3.87–5.11)
RDW: 12 % (ref 11.5–15.5)
WBC: 9.4 10*3/uL (ref 4.0–10.5)
nRBC: 0 % (ref 0.0–0.2)

## 2018-07-04 LAB — URINALYSIS, MICROSCOPIC (REFLEX)

## 2018-07-04 LAB — URINALYSIS, ROUTINE W REFLEX MICROSCOPIC
Bilirubin Urine: NEGATIVE
Glucose, UA: NEGATIVE mg/dL
Ketones, ur: NEGATIVE mg/dL
Nitrite: NEGATIVE
Protein, ur: NEGATIVE mg/dL
Specific Gravity, Urine: <1.005 — ABNORMAL LOW (ref 1.005–1.030)
pH: 6 (ref 5.0–8.0)

## 2018-07-04 LAB — COMPREHENSIVE METABOLIC PANEL
ALT: 29 U/L (ref 0–44)
AST: 23 U/L (ref 15–41)
Albumin: 4.6 g/dL (ref 3.5–5.0)
Alkaline Phosphatase: 124 U/L (ref 38–126)
Anion gap: 7 (ref 5–15)
BUN: 14 mg/dL (ref 8–23)
CO2: 28 mmol/L (ref 22–32)
Calcium: 8.9 mg/dL (ref 8.9–10.3)
Chloride: 102 mmol/L (ref 98–111)
Creatinine, Ser: 0.92 mg/dL (ref 0.44–1.00)
GFR calc non Af Amer: >60 mL/min (ref >60)
Glucose, Bld: 94 mg/dL (ref 70–99)
POTASSIUM: 3.8 mmol/L (ref 3.5–5.1)
Sodium: 137 mmol/L (ref 135–145)
Total Bilirubin: 0.5 mg/dL (ref 0.3–1.2)
Total Protein: 7.6 g/dL (ref 6.5–8.1)

## 2018-07-04 LAB — LIPASE, BLOOD: Lipase: 24 U/L (ref 11–51)

## 2018-07-04 MED ORDER — DICYCLOMINE HCL 10 MG/ML IM SOLN
20.0000 mg | Freq: Once | INTRAMUSCULAR | Status: AC
  Administered 2018-07-04: 20 mg via INTRAMUSCULAR
  Filled 2018-07-04: qty 2

## 2018-07-04 MED ORDER — ONDANSETRON HCL 4 MG/2ML IJ SOLN
4.0000 mg | Freq: Once | INTRAMUSCULAR | Status: AC
  Administered 2018-07-04: 4 mg via INTRAVENOUS
  Filled 2018-07-04: qty 2

## 2018-07-04 MED ORDER — SODIUM CHLORIDE 0.9 % IV BOLUS
1000.0000 mL | Freq: Once | INTRAVENOUS | Status: AC
  Administered 2018-07-04: 1000 mL via INTRAVENOUS

## 2018-07-04 MED ORDER — DICYCLOMINE HCL 20 MG PO TABS: 20.0000 mg | ORAL_TABLET | Freq: Two times a day (BID) | ORAL | 0 refills | Status: DC

## 2018-07-04 MED ORDER — ONDANSETRON 4 MG PO TBDP: ORAL_TABLET | ORAL | 0 refills | Status: DC

## 2018-07-04 MED ORDER — IOPAMIDOL (ISOVUE-300) INJECTION 61%
100.0000 mL | Freq: Once | INTRAVENOUS | Status: AC | PRN
  Administered 2018-07-04: 100 mL via INTRAVENOUS

## 2018-07-04 NOTE — ED Triage Notes (Signed)
Pt c/o upper left abdominal pain that has been ongoing for "a while." Pt had an ultrasound done last Friday and was told everything was fine.

## 2018-07-04 NOTE — Discharge Instructions (Signed)
Your evaluation today is reassuring, CT with no other acute findings and labs overall look good.  I suspect your symptoms may be due to biliary colic from the gallbladder sludge noted on your ultrasound last week.  I would like for you to follow-up outpatient with general surgery for continued management and evaluation of this.  In the meantime you can use Bentyl as needed for pain and Zofran as needed for nausea.  Follow-up with your PCP as well.  Return to the emergency department for worsening abdominal pain, persistent vomiting, blood in the stools, fevers or any other new or concerning symptoms.

## 2018-07-04 NOTE — ED Notes (Signed)
Pt given specimen cup for when able to void.

## 2018-07-04 NOTE — ED Provider Notes (Signed)
[facility] EMERGENCY DEPARTMENT Provider Note   CSN: [id number] Arrival date & time: 07/04/18  1142     History   Chief Complaint Chief Complaint  Patient presents with  . Abdominal Pain  . Nausea    HPI Regina Ruiz is a 63 y.o. female.  Regina Ruiz is a 63 y.o. female with a history of colitis, COPD, thyroid disease, depression and anxiety, who presents to the emergency department for evaluation of left upper quadrant abdominal pain.  She reports this pain has been occurring intermittently over the past month, it initially started in her right upper quadrant, but over the past 2 weeks has been worse in the left upper quadrant.  She reports associated nausea but no vomiting, she is also had decreased appetite.  She reports that she should her symptoms seem to worsen after eating, especially fatty foods.  She has not had any diarrhea, melena or hematochezia.  Has had issues with intermittent constipation but takes a stool softener which seems to help.  She denies any dysuria or urinary frequency, no hematuria, no flank pain.  She has not had any chest pain or shortness of breath, does have an occasional productive cough with her COPD but this is unchanged and not worse than usual.  No fevers or chills.     Past Medical History:  Diagnosis Date  . Anxiety   . Colitis   . COPD (chronic obstructive pulmonary disease) ([place])   . Depression   . Osteopenia   . Thyroid disease     Patient Active Problem List   Diagnosis Date Noted  . Right sided abdominal pain 06/22/2018  . Chest wall pain 04/09/2018  . COPD exacerbation ([place]) 04/09/2018  . Postinflammatory pulmonary fibrosis ([place]) 06/29/2017  . Upper airway cough syndrome 06/29/2017  . Smokers' cough ([place]) 02/26/2016  . Encounter for routine gynecological examination 02/26/2016  . Nocturnal leg cramps 07/12/2013  . Unspecified gastritis and gastroduodenitis without mention of hemorrhage 10/04/2012  . Routine  gynecological examination 12/16/2011  . Sleep disorder 03/21/2011  . Hypothyroid 02/19/2011  . Routine general medical examination at a health care facility 10/31/2010  . Vitamin D deficiency 12/06/2009  . ALLERGIC CONJUNCTIVITIS 10/23/2009  . PULMONARY NODULE 08/30/2008  . CIGARETTE SMOKER 08/25/2008  . Anxiety 09/01/2006  . COPD GOLD II still smoking  09/01/2006  . Disorder of bone and cartilage 04/16/2006    Past Surgical History:  Procedure Laterality Date  . DILATION AND CURETTAGE OF UTERUS    . endomet polyp  6/12   removed   . ENDOMETRIAL BIOPSY  6/12   Dr Phineas Real- negative  . HYSTEROSCOPY  6.7.12   [facility]: ENDOMETRIAL POLYP     OB History   No obstetric history on file.      Home Medications    Prior to Admission medications   Medication Sig Start Date End Date Taking? Authorizing Provider  albuterol (PROVENTIL HFA;VENTOLIN HFA) 108 (90 Base) MCG/ACT inhaler Inhale 2 puffs into the lungs every 4 (four) hours as needed for wheezing. 06/22/18  Yes Tower, Wynelle Fanny, MD  levothyroxine (SYNTHROID, LEVOTHROID) 88 MCG tablet Take 88 mcg by mouth daily.     Yes [provider]  Multiple Vitamins-Minerals (MULTIVITAMIN WITH MINERALS) tablet Take 1 tablet by mouth daily.   Yes [provider]  ALPRAZolam (XANAX) 0.25 MG tablet TAKE 1 TAB BY MOUTH 2 TIMES DAILY AS NEEDED 04/28/18   Tower, Wynelle Fanny, MD  budesonide-formoterol [facility]) 160-4.5 MCG/ACT inhaler Inhale 2 puffs  into the lungs 2 (two) times daily. 07/30/17   Tanda Rockers, MD  dicyclomine (BENTYL) 20 MG tablet Take 1 tablet (20 mg total) by mouth 2 (two) times daily. 07/04/18   Jacqlyn Larsen, PA-C  ondansetron (ZOFRAN ODT) 4 MG disintegrating tablet 4mg  ODT q4 hours prn nausea/vomit 07/04/18   Jacqlyn Larsen, PA-C  SUMAtriptan (IMITREX) 100 MG tablet Take 1 pill orally for migraine - if necessary may repeat once in 2 hours 06/02/17   Tower, Wynelle Fanny, MD    Family History Family History  Problem  Relation Age of Onset  . Cancer Father        lung cancer and brain cancer  . Breast cancer Sister   . Stroke Maternal Grandfather   . Stroke Paternal Grandfather   . Heart disease Mother     Social History Social History   Tobacco Use  . Smoking status: Current Every Day Smoker    Packs/day: 1.00    Years: 46.00    Pack years: 46.00    Types: Cigarettes  . Smokeless tobacco: Never Used  Substance Use Topics  . Alcohol use: Yes    Alcohol/week: 0.0 standard drinks    Comment: rare  . Drug use: No     Allergies   Butalbital-aspirin-caffeine; Fosamax [alendronate sodium]; Meperidine hcl; Methocarbamol; and Zolpidem tartrate   Review of Systems Review of Systems  Constitutional: Negative for chills and fever.  HENT: Negative for congestion, rhinorrhea and sore throat.   Respiratory: Negative for cough and shortness of breath.   Cardiovascular: Negative for chest pain.  Gastrointestinal: Positive for abdominal pain and nausea. Negative for constipation, diarrhea and vomiting.  Genitourinary: Negative for dysuria, flank pain, frequency, hematuria, pelvic pain, vaginal bleeding and vaginal discharge.  Musculoskeletal: Negative for arthralgias and myalgias.  Skin: Negative for color change and rash.  Neurological: Negative for dizziness, syncope, light-headedness and headaches.  All other systems reviewed and are negative.    Physical Exam Updated Vital Signs BP (!) 154/92   Pulse 78   Temp 98.1 F (36.7 C) (Oral)   Resp 16   Ht 5\' 4"  (1.626 m)   Wt 69.4 kg   LMP 06/16/2005   SpO2 96%   BMI 26.26 kg/m   Physical Exam Vitals signs and nursing note reviewed.  Constitutional:      General: She is not in acute distress.    Appearance: She is well-developed and normal weight. She is not ill-appearing or diaphoretic.  HENT:     Head: Normocephalic and atraumatic.     Mouth/Throat:     Mouth: Mucous membranes are moist.     Pharynx: Oropharynx is clear.  Eyes:       General:        Right eye: No discharge.        Left eye: No discharge.     Pupils: Pupils are equal, round, and reactive to light.  Neck:     Musculoskeletal: Neck supple.  Cardiovascular:     Rate and Rhythm: Normal rate and regular rhythm.     Heart sounds: Normal heart sounds. No murmur. No friction rub. No gallop.   Pulmonary:     Effort: Pulmonary effort is normal. No respiratory distress.     Breath sounds: Normal breath sounds. No wheezing or rales.     Comments: Respirations equal and unlabored, patient able to speak in full sentences, lungs clear to auscultation bilaterally Abdominal:     General: Abdomen is flat. Bowel  sounds are normal. There is no distension.     Palpations: Abdomen is soft. There is no mass.     Tenderness: There is abdominal tenderness in the right upper quadrant, epigastric area and left upper quadrant. There is no right CVA tenderness, left CVA tenderness, guarding or rebound. Negative signs include Murphy's sign.     Comments: Abdomen is soft, nondistended, bowel sounds present throughout, there is tenderness across the upper abdomen without guarding or rebound tenderness, no rigidity, there is no focal Murphy sign, no lower abdominal tenderness, no CVA tenderness bilaterally.  Musculoskeletal:        General: No deformity.  Skin:    General: Skin is warm and dry.     Capillary Refill: Capillary refill takes less than 2 seconds.  Neurological:     Mental Status: She is alert and oriented to person, place, and time.     Coordination: Coordination normal.     Comments: Speech is clear, able to follow commands CN III-XII intact Normal strength in upper and lower extremities bilaterally including dorsiflexion and plantar flexion, strong and equal grip strength Sensation normal to light and sharp touch Moves extremities without ataxia, coordination intact   Psychiatric:        Mood and Affect: Mood normal.        Behavior: Behavior normal.       ED Treatments / Results  Labs (all labs ordered are listed, but only abnormal results are displayed) Labs Reviewed  URINALYSIS, ROUTINE W REFLEX MICROSCOPIC - Abnormal; Notable for the following components:      Result Value   Specific Gravity, Urine <1.005 (*)    Hgb urine dipstick TRACE (*)    Leukocytes, UA TRACE (*)    All other components within normal limits  URINALYSIS, MICROSCOPIC (REFLEX) - Abnormal; Notable for the following components:   Bacteria, UA RARE (*)    All other components within normal limits  CBC WITH DIFFERENTIAL/PLATELET - Abnormal; Notable for the following components:   RBC 5.19 (*)    Hemoglobin 15.5 (*)    HCT 47.3 (*)    All other components within normal limits  COMPREHENSIVE METABOLIC PANEL  LIPASE, BLOOD    EKG None  Radiology Ct Abdomen Pelvis W Contrast  Result Date: 07/04/2018 CLINICAL DATA:  63 year old with upper abdominal pain. EXAM: CT ABDOMEN AND PELVIS WITH CONTRAST TECHNIQUE: Multidetector CT imaging of the abdomen and pelvis was performed using the standard protocol following bolus administration of intravenous contrast. CONTRAST:  138mL ISOVUE-300 IOPAMIDOL (ISOVUE-300) INJECTION 61% COMPARISON:  Ultrasound 06/25/2018 and CT 09/13/2010 FINDINGS: Lower chest: Centrilobular emphysema. Scattered poorly defined nodular and linear densities at the lung bases particularly in the right lower lobe. Some of these are unchanged since 2012 but other areas were not imaged on the previous examination. Findings are most compatible with post infectious or inflammatory changes. No large pleural effusions. Hepatobiliary: Punctate peripheral low-density structure near the anterior hepatic dome may be chronic. No suspicious liver lesions. The portal venous system is patent. Normal appearance of the gallbladder. No biliary dilatation. Pancreas: Unremarkable. No pancreatic ductal dilatation or surrounding inflammatory changes. Spleen: Normal in size  without focal abnormality. Adrenals/Urinary Tract: Normal adrenal glands. Normal appearance of the urinary bladder. Normal appearance of both kidneys. No evidence for stones or hydronephrosis. Stomach/Bowel: Small hiatal hernia. Diverticulosis in the sigmoid colon. No acute colonic inflammation. Appendix is mildly dilated containing gas and stool-like material but no inflammation. Normal appearance of the small bowel. Vascular/Lymphatic: Atherosclerotic calcifications  involving the abdominal aorta without aneurysm. Venous structures are unremarkable. No lymph node enlargement in the abdomen or pelvis. Reproductive: Uterus and bilateral adnexa are unremarkable. Other: Negative for free air.  Negative for free fluid. Musculoskeletal: Stable focus of sclerosis in the right ilium. No acute bone abnormality. IMPRESSION: 1. No acute abnormality in the abdomen or pelvis. 2. Colonic diverticulosis without acute colonic inflammation. 3. Centrilobular emphysema with scattered poorly defined densities at the lung bases, particularly in the right lower lobe. Some of these densities are chronic and other areas are indeterminate. Findings are most compatible with post infectious or inflammatory changes. 4. Aortic Atherosclerosis (ICD10-I70.0) and Emphysema (ICD10-J43.9). Electronically Signed   By: Markus Daft M.D.   On: 07/04/2018 14:50    Procedures Procedures (including critical care time)  Medications Ordered in ED Medications  sodium chloride 0.9 % bolus 1,000 mL ( Intravenous Stopped 07/04/18 1525)  ondansetron (ZOFRAN) injection 4 mg (4 mg Intravenous Given 07/04/18 1351)  dicyclomine (BENTYL) injection 20 mg (20 mg Intramuscular Given 07/04/18 1352)  iopamidol (ISOVUE-300) 61 % injection 100 mL (100 mLs Intravenous Contrast Given 07/04/18 1432)     Initial Impression / Assessment and Plan / ED Course  I have reviewed the triage vital signs and the nursing notes.  Pertinent labs & imaging results that were  available during my care of the patient were reviewed by me and considered in my medical decision making (see chart for details).  Patient presents to the emergency department for evaluation of left upper quadrant abdominal pain.  She has been having upper abdominal pains intermittently for the last month, they started in the right upper quadrant but are now worse on the left.  She initially saw her PCP regarding the symptoms had a abdominal ultrasound done which showed some gallbladder sludge but no other acute findings.  Pain is worse after eating.  No nausea or vomiting, no fevers, no bowel symptoms or urinary symptoms.  Patient mildly hypertensive but vitals otherwise normal and she appears well.  On exam she has tenderness throughout the upper abdomen without focal guarding, negative Murphy sign.  I suspect the patient's symptoms could be due to biliary colic in the setting of gallbladder sludge but given that she has tenderness across the upper abdomen will check abdominal labs and CT abdomen pelvis to look for any other acute cause for patient's pain.  IV fluids, Zofran and Bentyl given for symptomatic management.  Labs overall very reassuring, no leukocytosis, hemoglobin slightly elevated, this may be in the setting of mild hemoconcentration, there are no acute electrolyte derangements, normal renal and liver function and normal lipase.  Urinalysis not concerning for infection.  CT without any acute abdominal findings, no signs of cholecystitis and patient does not have leukocytosis, elevated liver enzymes or positive Murphy sign to suggest this.  There are signs of chronic diverticulosis but no evidence of diverticulitis on scan, chronic emphysematous changes noted in the lung bases.  On reevaluation patient reports improvement in her pain.  I have discussed results of patient's work-up and then I feel pain is likely due to biliary colic versus gastritis.  Will have patient follow-up with general  surgery for further evaluation of gallbladder sludge.  Will discharge with Zofran and Bentyl for symptomatic management.  Encourage follow-up with PCP as well.  Return precautions discussed.  Patient expresses understanding and agreement with this plan.  Discharged home in good condition.  Final Clinical Impressions(s) / ED Diagnoses   Final diagnoses:  Pain  of upper abdomen    ED Discharge Orders         Ordered    dicyclomine (BENTYL) 20 MG tablet  2 times daily     07/04/18 1518    ondansetron (ZOFRAN ODT) 4 MG disintegrating tablet     07/04/18 1518           Jacqlyn Larsen, [place] 07/05/18 2245    Virgel Manifold, MD 07/06/18 [id number]

## 2018-07-05 ENCOUNTER — Encounter: Payer: Self-pay | Admitting: Family Medicine

## 2018-07-15 DIAGNOSIS — R0789 Other chest pain: Secondary | ICD-10-CM | POA: Diagnosis not present

## 2018-08-22 ENCOUNTER — Other Ambulatory Visit: Payer: Self-pay | Admitting: Internal Medicine

## 2018-12-28 ENCOUNTER — Ambulatory Visit (INDEPENDENT_AMBULATORY_CARE_PROVIDER_SITE_OTHER): Payer: 59 | Admitting: Family Medicine

## 2018-12-28 ENCOUNTER — Encounter: Payer: Self-pay | Admitting: Family Medicine

## 2018-12-28 VITALS — Wt 145.0 lb

## 2018-12-28 DIAGNOSIS — E559 Vitamin D deficiency, unspecified: Secondary | ICD-10-CM

## 2018-12-28 DIAGNOSIS — J449 Chronic obstructive pulmonary disease, unspecified: Secondary | ICD-10-CM

## 2018-12-28 DIAGNOSIS — Z1231 Encounter for screening mammogram for malignant neoplasm of breast: Secondary | ICD-10-CM

## 2018-12-28 DIAGNOSIS — M899 Disorder of bone, unspecified: Secondary | ICD-10-CM | POA: Diagnosis not present

## 2018-12-28 DIAGNOSIS — M949 Disorder of cartilage, unspecified: Secondary | ICD-10-CM

## 2018-12-28 DIAGNOSIS — F17200 Nicotine dependence, unspecified, uncomplicated: Secondary | ICD-10-CM

## 2018-12-28 DIAGNOSIS — E039 Hypothyroidism, unspecified: Secondary | ICD-10-CM | POA: Diagnosis not present

## 2018-12-28 DIAGNOSIS — J410 Simple chronic bronchitis: Secondary | ICD-10-CM | POA: Diagnosis not present

## 2018-12-28 DIAGNOSIS — E2839 Other primary ovarian failure: Secondary | ICD-10-CM

## 2018-12-28 DIAGNOSIS — J8410 Pulmonary fibrosis, unspecified: Secondary | ICD-10-CM

## 2018-12-28 MED ORDER — BENZONATATE 200 MG PO CAPS: 200.0000 mg | ORAL_CAPSULE | Freq: Three times a day (TID) | ORAL | 5 refills | Status: DC | PRN

## 2018-12-28 NOTE — Assessment & Plan Note (Signed)
Ongoing  1ppd or more  Worse with working at home  She is not yet ready to quit but has used chantix in the past Disc in detail risks of smoking and possible outcomes including copd, vascular/ heart disease, cancer , respiratory and sinus infections  Pt voices understanding Enc pt to call if /when she needs a chantix px

## 2018-12-28 NOTE — Assessment & Plan Note (Signed)
Clinically stable. 

## 2018-12-28 NOTE — Assessment & Plan Note (Signed)
Mammogram ref done  Pt reports no changes on self exam

## 2018-12-28 NOTE — Patient Instructions (Addendum)
Let me know if cough worsens or does not improve /or fever or new symptoms just call and I will order covid test   When you decide to quit smoking please let us know  i'm happy to px chantix   The office will call you re: mammogram and bone density test

## 2018-12-28 NOTE — Assessment & Plan Note (Signed)
Pt continues to smoke (not ready to quit) Overall stable with symbicort and prn albuterol  More smoker's cough lately Very high risk for covid - prefer that she works from home if possible

## 2018-12-28 NOTE — Assessment & Plan Note (Signed)
Continues to see Dr Ronnald Collum for this  Per pt stable with last check No clinical changes

## 2018-12-28 NOTE — Assessment & Plan Note (Addendum)
In pt with copd Not ready to quit smoking  Clear phlegm if any and little to no wheezing  Will watch closely for fever/sob or other symptoms (would consider covid testing)  Px tessalon for cough  Continue copd medications/inhalers

## 2018-12-28 NOTE — Assessment & Plan Note (Signed)
Due for dexa  This was ordered No falls or fx  Taking ca and D Enc exercise and smoking cessation

## 2018-12-28 NOTE — Progress Notes (Signed)
Virtual Visit via Video Note  I connected with Regina Ruiz on 12/28/18 at  3:45 PM EDT by a video enabled telemedicine application and verified that I am speaking with the correct person using two identifiers.  Location: Patient: home Provider: office    I discussed the limitations of evaluation and management by telemedicine and the availability of in person appointments. The patient expressed understanding and agreed to proceed.  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 a few cases at work- so now working at home  Does not mind  She is very high risk for covid   Weight - 145 lb  Wt Readings from Last 3 Encounters:  07/04/18 153 lb (69.4 kg)  06/22/18 153 lb (69.4 kg)  04/28/18 151 lb 8 oz (68.7 kg)  trying to exercise at home when she can  On weekends- likes to swim   Smoking status : around 1ppd /sometimes more  Esp working at home  Not ready to quit yet-but closer  Used chantix in the past    Copd gold 2-worse in the heat -overall fairly well controlled with occasional exacerbation  H/o post inflammatory fibrosis    Needs tessalon refill -smoker's cough Clear phlegm  Annoying -wakes up coughing  Wheezes just a little/ uses albuterol and symbicort  Also allegra for post nasal drip  No fever (checks temp daily)  Will ask for covid test if she feels sick    Hypothyroidism  Pt has no clinical changes No change in energy level/ hair or skin/ edema and no tremor Sees endocrinology - Dr Felipa Evener (last visit stable w/o change)  On 88 mcg levothy  Osteopenia -she wants to go  8/17 dexa  H/o vit D def - level in 65s with supplementation   Needs a mammogram  [facility]  We will refer   Doing well with headaches  Does not need imitrex as often     Patient Active Problem List   Diagnosis Date Noted  . Estrogen deficiency 12/28/2018  . Screening mammogram, encounter for 12/28/2018   . Right sided abdominal pain 06/22/2018  . Chest wall pain 04/09/2018  . COPD exacerbation ([place]) 04/09/2018  . Postinflammatory pulmonary fibrosis ([place]) 06/29/2017  . Upper airway cough syndrome 06/29/2017  . Smokers' cough ([place]) 02/26/2016  . Encounter for routine gynecological examination 02/26/2016  . Nocturnal leg cramps 07/12/2013  . Unspecified gastritis and gastroduodenitis without mention of hemorrhage 10/04/2012  . Routine gynecological examination 12/16/2011  . Sleep disorder 03/21/2011  . Hypothyroid 02/19/2011  . Routine general medical examination at a health care facility 10/31/2010  . Vitamin D deficiency 12/06/2009  . ALLERGIC CONJUNCTIVITIS 10/23/2009  . PULMONARY NODULE 08/30/2008  . CIGARETTE SMOKER 08/25/2008  . Anxiety 09/01/2006  . COPD GOLD II still smoking  09/01/2006  . Disorder of bone and cartilage 04/16/2006   Past Medical History:  Diagnosis Date  . Anxiety   . Colitis   . COPD (chronic obstructive pulmonary disease) ([place])   . Depression   . Osteopenia   . Thyroid disease    Past Surgical History:  Procedure Laterality Date  . DILATION AND CURETTAGE OF UTERUS    . endomet polyp  6/12   removed   . ENDOMETRIAL BIOPSY  6/12   Dr Phineas Real- negative  . HYSTEROSCOPY  6.7.12   [facility]: ENDOMETRIAL POLYP   Social History   Tobacco Use  . Smoking  status: Current Every Day Smoker    Packs/day: 1.00    Years: 46.00    Pack years: 46.00    Types: Cigarettes  . Smokeless tobacco: Never Used  Substance Use Topics  . Alcohol use: Yes    Alcohol/week: 0.0 standard drinks    Comment: rare  . Drug use: No   Family History  Problem Relation Age of Onset  . Cancer Father        lung cancer and brain cancer  . Breast cancer Sister   . Stroke Maternal Grandfather   . Stroke Paternal Grandfather   . Heart disease Mother    Allergies  Allergen Reactions  . Butalbital-Aspirin-Caffeine     REACTION: unspecified  . Fosamax [Alendronate Sodium]      GI  . Meperidine Hcl     REACTION: unspecified  . Methocarbamol Other (See Comments)    Causes Headaches/ Migraines  . Zolpidem Tartrate    Current Outpatient Medications on File Prior to Visit  Medication Sig Dispense Refill  . albuterol (PROVENTIL HFA;VENTOLIN HFA) 108 (90 Base) MCG/ACT inhaler Inhale 2 puffs into the lungs every 4 (four) hours as needed for wheezing. 1 Inhaler 5  . ALPRAZolam (XANAX) 0.25 MG tablet TAKE 1 TAB BY MOUTH 2 TIMES DAILY AS NEEDED 60 tablet 0  . levothyroxine (SYNTHROID, LEVOTHROID) 88 MCG tablet Take 88 mcg by mouth daily.      . Multiple Vitamins-Minerals (MULTIVITAMIN WITH MINERALS) tablet Take 1 tablet by mouth daily.    . SUMAtriptan (IMITREX) 100 MG tablet Take 1 pill orally for migraine - if necessary may repeat once in 2 hours 10 tablet 3  . SYMBICORT 160-4.5 MCG/ACT inhaler INHALE 2 PUFFS INTO THE LUNGS TWICE DAILY 10.2 Inhaler 0   No current facility-administered medications on file prior to visit.    Review of Systems  Constitutional: Negative for chills, diaphoresis, fever, malaise/fatigue and weight loss.  HENT: Negative for congestion, sinus pain and sore throat.   Eyes: Negative for blurred vision, discharge and redness.  Respiratory: Positive for cough and sputum production. Negative for hemoptysis, shortness of breath and wheezing.   Cardiovascular: Negative for chest pain and palpitations.  Gastrointestinal: Negative for abdominal pain, heartburn, nausea and vomiting.  Musculoskeletal: Negative for myalgias.  Skin: Negative for itching and rash.  Neurological: Negative for dizziness and headaches.  Endo/Heme/Allergies: Negative for polydipsia.  Psychiatric/Behavioral: Negative for depression and memory loss. The patient is not nervous/anxious.     Observations/Objective: Pt sounds like her normal self  Cheerful and talkative  Nl affect  Not hoarse  No cough or sob on interview  Nl cognition    Assessment and Plan: Problem List  Items Addressed This Visit      Respiratory   COPD GOLD II still smoking     Pt continues to smoke (not ready to quit) Overall stable with symbicort and prn albuterol  More smoker's cough lately Very high risk for covid - prefer that she works from home if possible       Relevant Medications   benzonatate (TESSALON) 200 MG capsule   Smokers' cough ([place]) - Primary    In pt with copd Not ready to quit smoking  Clear phlegm if any and little to no wheezing  Will watch closely for fever/sob or other symptoms (would consider covid testing)  Px tessalon for cough  Continue copd medications/inhalers      Postinflammatory pulmonary fibrosis ([place])    Clinically stable  Endocrine   Hypothyroid    Continues to see Dr Ronnald Collum for this  Per pt stable with last check No clinical changes         Musculoskeletal and Integument   Disorder of bone and cartilage    Due for dexa  This was ordered No falls or fx  Taking ca and D Enc exercise and smoking cessation         Other   Vitamin D deficiency    Pt continues current D dose Disc imp to bone and overall health      CIGARETTE SMOKER    Ongoing  1ppd or more  Worse with working at home  She is not yet ready to quit but has used chantix in the past Disc in detail risks of smoking and possible outcomes including copd, vascular/ heart disease, cancer , respiratory and sinus infections  Pt voices understanding Enc pt to call if /when she needs a chantix px      Estrogen deficiency   Relevant Orders   DG Bone Density   Screening mammogram, encounter for    Mammogram ref done  Pt reports no changes on self exam      Relevant Orders   MM 3D SCREEN BREAST BILATERAL      Follow Up Instructions: Let me know if cough worsens or does not improve /or fever or new symptoms just call and I will order covid test   When you decide to quit smoking please let us know  i'm happy to px chantix   The office will call you  re: mammogram and bone density test  I discussed the assessment and treatment plan with the patient. The patient was provided an opportunity to ask questions and all were answered. The patient agreed with the plan and demonstrated an understanding of the instructions.   The patient was advised to call back or seek an in-person evaluation if the symptoms worsen or if the condition fails to improve as anticipated.     Loura Pardon, MD

## 2018-12-28 NOTE — Assessment & Plan Note (Signed)
Pt continues current D dose Disc imp to bone and overall health

## 2019-02-16 ENCOUNTER — Encounter: Payer: Self-pay | Admitting: Family Medicine

## 2019-03-02 ENCOUNTER — Encounter: Payer: Self-pay | Admitting: Family Medicine

## 2019-03-04 IMAGING — DX DG CHEST 2V
2 series · 2 of 2 positions shown · non-contrast
Comparison: 07/30/2017, 06/02/2017, CT chest 06/20/2003

CLINICAL DATA: COPD with cough

EXAM:
CHEST - 2 VIEW

[chest pa]
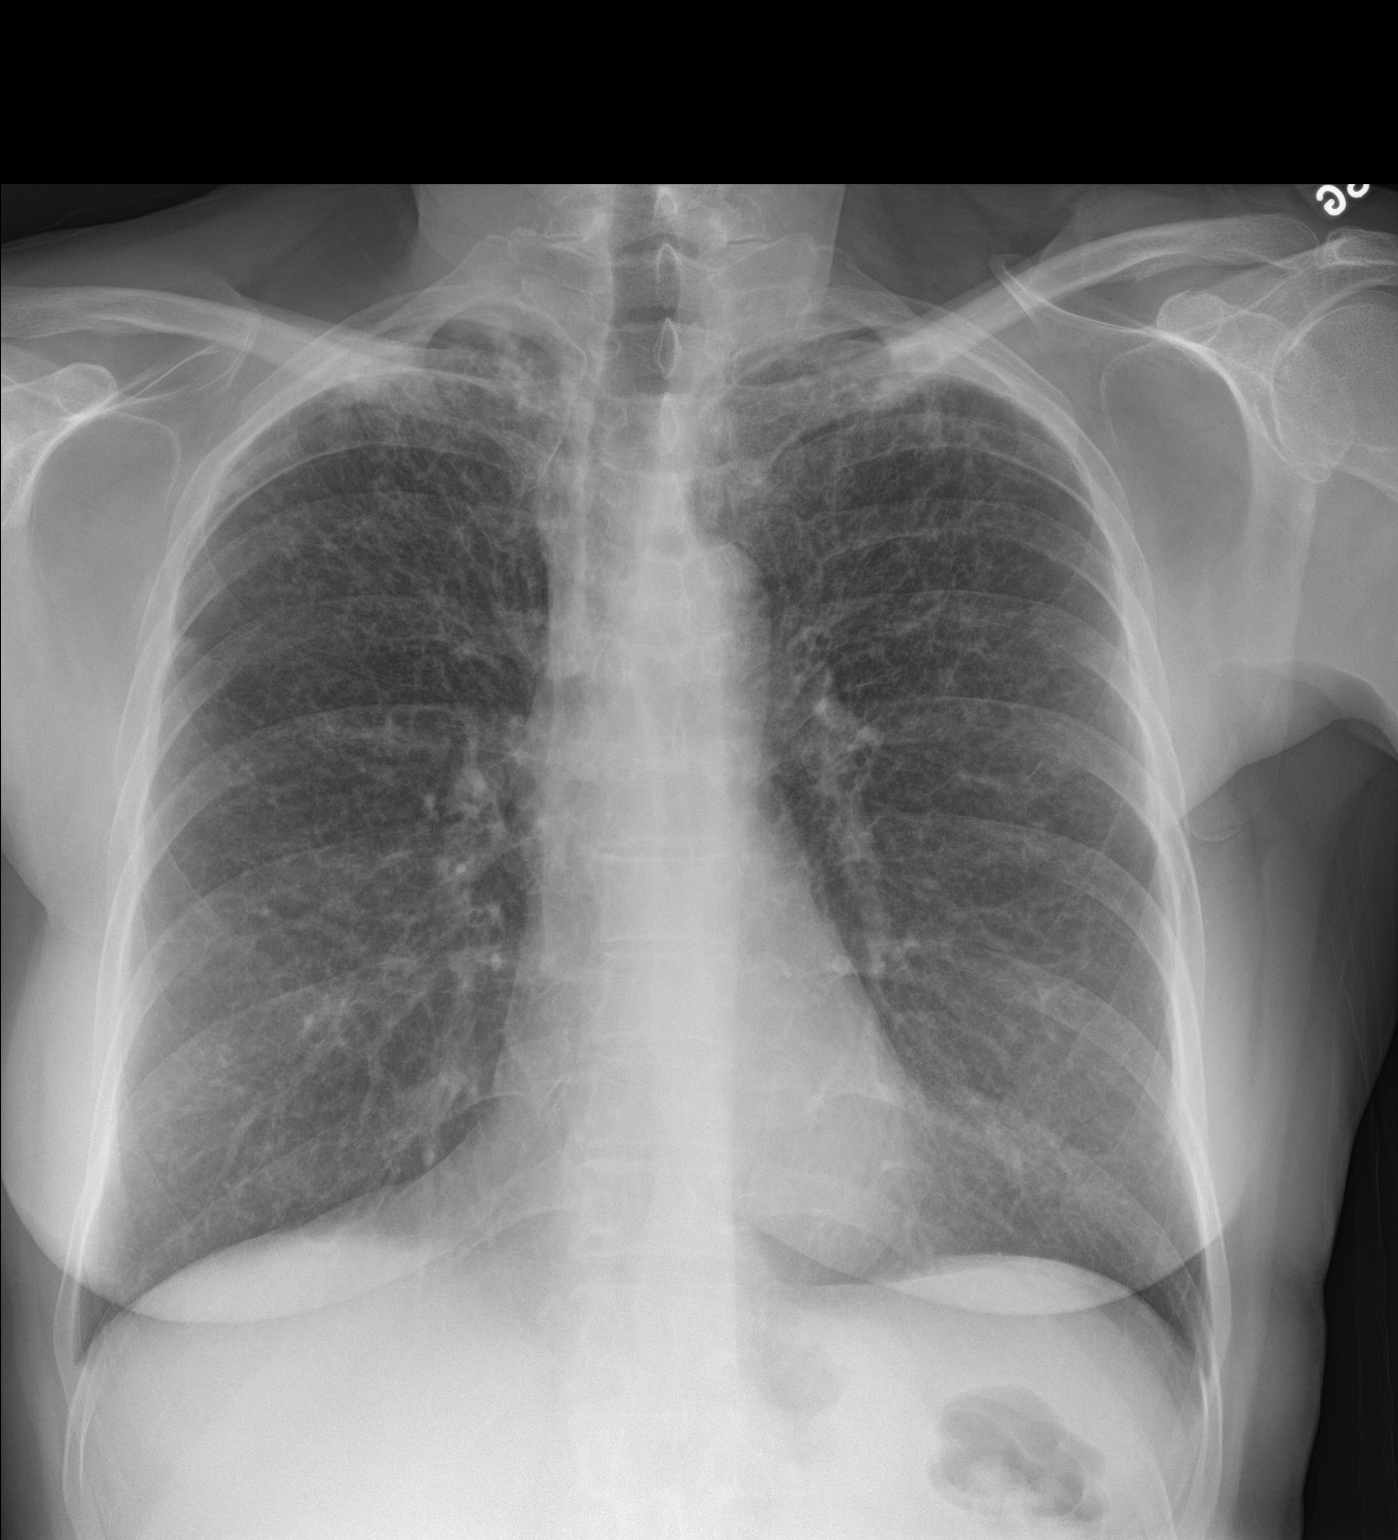

[chest lat]
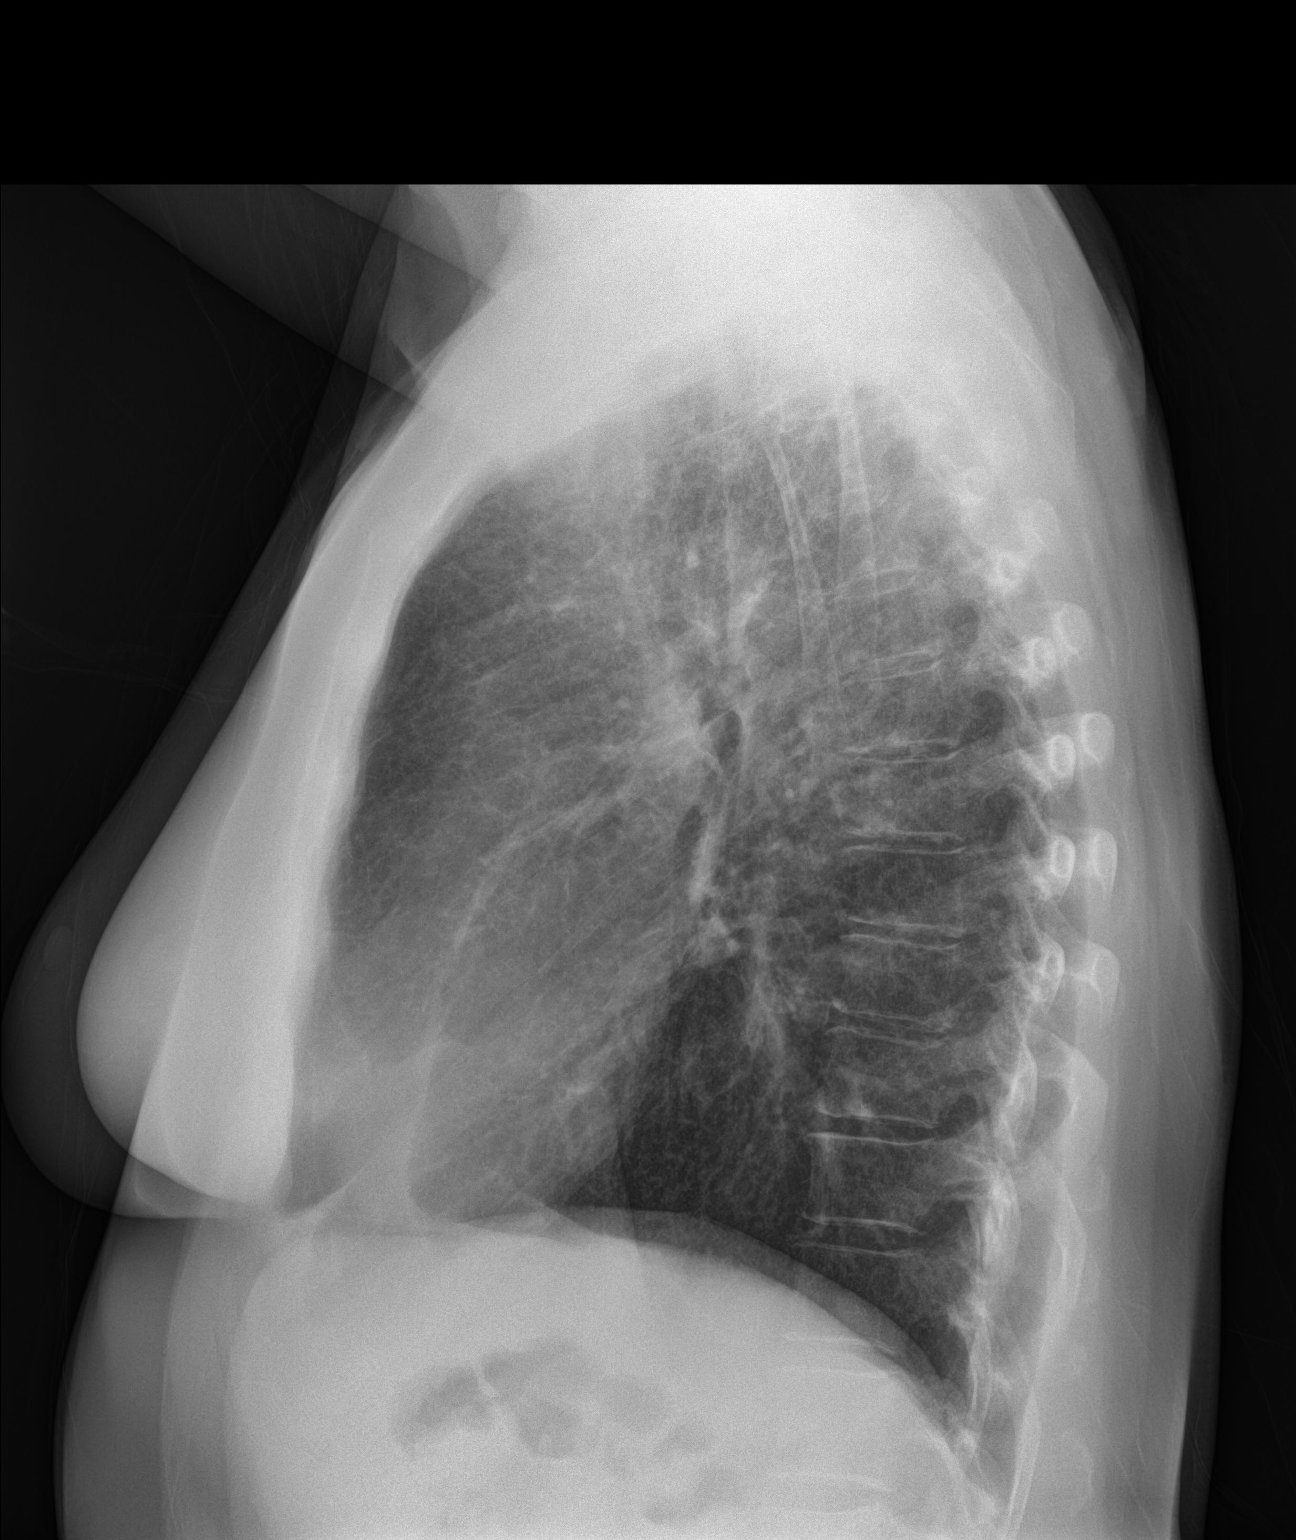

[2 of 2 positions shown; findings below may reference images not displayed]

FINDINGS: Emphysematous disease. Diffuse reticular opacity. Stable scarring in
the bilateral lung apices. No acute opacity or pleural effusion.
Normal heart size. No pneumothorax.
IMPRESSION: No active cardiopulmonary disease. Emphysematous disease and stable
diffuse reticular changes. Scarring at the apices.

## 2019-06-27 ENCOUNTER — Encounter: Payer: Self-pay | Admitting: Family Medicine

## 2019-10-14 ENCOUNTER — Other Ambulatory Visit: Payer: Self-pay

## 2019-10-14 ENCOUNTER — Encounter: Payer: Self-pay | Admitting: Family Medicine

## 2019-10-14 ENCOUNTER — Ambulatory Visit: Payer: 59 | Admitting: Family Medicine

## 2019-10-14 VITALS — BP 138/86 | HR 93 | Temp 97.5°F | Ht 64.0 in | Wt 143.1 lb

## 2019-10-14 DIAGNOSIS — R109 Unspecified abdominal pain: Secondary | ICD-10-CM | POA: Diagnosis not present

## 2019-10-14 DIAGNOSIS — R1013 Epigastric pain: Secondary | ICD-10-CM | POA: Diagnosis not present

## 2019-10-14 DIAGNOSIS — J410 Simple chronic bronchitis: Secondary | ICD-10-CM | POA: Diagnosis not present

## 2019-10-14 MED ORDER — HYOSCYAMINE SULFATE SL 0.125 MG SL SUBL: 0.1250 mg | SUBLINGUAL_TABLET | Freq: Two times a day (BID) | SUBLINGUAL | 1 refills | Status: DC | PRN

## 2019-10-14 NOTE — Progress Notes (Signed)
Subjective:    Patient ID: Regina Ruiz, female    DOB: March 21, 1956, 64 y.o.   MRN: [id number]  This visit occurred during the SARS-CoV-2 public health emergency.  Safety protocols were in place, including screening questions prior to the visit, additional usage of staff PPE, and extensive cleaning of exam room while observing appropriate contact time as indicated for disinfecting solutions.    HPI Pt presents with c/o low abd cramping   Wt Readings from Last 3 Encounters:  10/14/19 143 lb 2 oz (64.9 kg)  12/28/18 145 lb (65.8 kg)  07/04/18 153 lb (69.4 kg)   24.57 kg/m   She had her covid vaccines   A recurrent problem  Having sharp pains /cramping in low abdomen  It buckles her over at times Gets bubbles in her upper abd at times - can see bloated looking areas   Feels like the muscles are cramping  She still gets leg cramps at night   Working at home  Some outdoor time  Trying to get more exercise in warm weather   Some burping/belching if she eats the wrong things  Cooked tomato products are the worst -is careful  No heartburn  occ some burning in epigastric area-not often (pepto or tums help) Some constipation recently  She takes colace or dulcolax   occ stool is dark -from pepto   Not drinking enough water  Some tea  No alcohol   She ran out of mvi - usually takes one pill daily  Found out she is D deficient- planning to add 4000 iu   Had a colonoscopy in 2012   Nl blood work from endocrinoginology  Recently   Patient Active Problem List   Diagnosis Date Noted  . Abdominal cramping 10/14/2019  . Epigastric burning sensation 10/14/2019  . Estrogen deficiency 12/28/2018  . Screening mammogram, encounter for 12/28/2018  . Chest wall pain 04/09/2018  . Postinflammatory pulmonary fibrosis ([place]) 06/29/2017  . Upper airway cough syndrome 06/29/2017  . Smokers' cough ([place]) 02/26/2016  . Encounter for routine gynecological examination 02/26/2016  .  Nocturnal leg cramps 07/12/2013  . Routine gynecological examination 12/16/2011  . Sleep disorder 03/21/2011  . Hypothyroid 02/19/2011  . Routine general medical examination at a health care facility 10/31/2010  . Vitamin D deficiency 12/06/2009  . ALLERGIC CONJUNCTIVITIS 10/23/2009  . PULMONARY NODULE 08/30/2008  . CIGARETTE SMOKER 08/25/2008  . Anxiety 09/01/2006  . COPD GOLD II still smoking  09/01/2006  . Disorder of bone and cartilage 04/16/2006   Past Medical History:  Diagnosis Date  . Anxiety   . Colitis   . COPD (chronic obstructive pulmonary disease) ([place])   . Depression   . Osteopenia   . Thyroid disease    Past Surgical History:  Procedure Laterality Date  . DILATION AND CURETTAGE OF UTERUS    . endomet polyp  6/12   removed   . ENDOMETRIAL BIOPSY  6/12   Dr Phineas Real- negative  . HYSTEROSCOPY  6.7.12   [facility]: ENDOMETRIAL POLYP   Social History   Tobacco Use  . Smoking status: Current Every Day Smoker    Packs/day: 1.00    Years: 46.00    Pack years: 46.00    Types: Cigarettes  . Smokeless tobacco: Never Used  Substance Use Topics  . Alcohol use: Yes    Alcohol/week: 0.0 standard drinks    Comment: rare  . Drug use: No   Family History  Problem Relation Age of Onset  . Cancer  Father        lung cancer and brain cancer  . Breast cancer Sister   . Stroke Maternal Grandfather   . Stroke Paternal Grandfather   . Heart disease Mother    Allergies  Allergen Reactions  . Butalbital-Aspirin-Caffeine     REACTION: unspecified  . Fosamax [Alendronate Sodium]     GI  . Meperidine Hcl     REACTION: unspecified  . Methocarbamol Other (See Comments)    Causes Headaches/ Migraines  . Zolpidem Tartrate    Current Outpatient Medications on File Prior to Visit  Medication Sig Dispense Refill  . albuterol (PROVENTIL HFA;VENTOLIN HFA) 108 (90 Base) MCG/ACT inhaler Inhale 2 puffs into the lungs every 4 (four) hours as needed for wheezing. 1 Inhaler 5  .  ALPRAZolam (XANAX) 0.25 MG tablet TAKE 1 TAB BY MOUTH 2 TIMES DAILY AS NEEDED 60 tablet 0  . benzonatate (TESSALON) 200 MG capsule Take 1 capsule (200 mg total) by mouth 3 (three) times daily as needed. 30 capsule 5  . levothyroxine (SYNTHROID, LEVOTHROID) 88 MCG tablet Take 88 mcg by mouth daily.      . Multiple Vitamins-Minerals (MULTIVITAMIN WITH MINERALS) tablet Take 1 tablet by mouth daily.    . SUMAtriptan (IMITREX) 100 MG tablet Take 1 pill orally for migraine - if necessary may repeat once in 2 hours 10 tablet 3  . SYMBICORT 160-4.5 MCG/ACT inhaler INHALE 2 PUFFS INTO THE LUNGS TWICE DAILY 10.2 Inhaler 0   No current facility-administered medications on file prior to visit.    Review of Systems  Constitutional: Negative for activity change, appetite change, fatigue, fever and unexpected weight change.  HENT: Negative for congestion, ear pain, rhinorrhea, sinus pressure and sore throat.   Eyes: Negative for pain, redness and visual disturbance.  Respiratory: Positive for cough. Negative for shortness of breath and wheezing.        Chronic cough from smoking/copd  Cardiovascular: Negative for chest pain and palpitations.  Gastrointestinal: Positive for abdominal pain. Negative for abdominal distention, anal bleeding, blood in stool, constipation, diarrhea, nausea, rectal pain and vomiting.       Epigastric burning  abd cramping  Endocrine: Negative for polydipsia and polyuria.  Genitourinary: Negative for dysuria, frequency and urgency.  Musculoskeletal: Negative for arthralgias, back pain and myalgias.  Skin: Negative for pallor and rash.  Allergic/Immunologic: Negative for environmental allergies.  Neurological: Negative for dizziness, syncope and headaches.  Hematological: Negative for adenopathy. Does not bruise/bleed easily.  Psychiatric/Behavioral: Negative for decreased concentration and dysphoric mood. The patient is not nervous/anxious.        Objective:   Physical  Exam Constitutional:      General: She is not in acute distress.    Appearance: Normal appearance. She is well-developed and normal weight. She is not ill-appearing or diaphoretic.  HENT:     Head: Normocephalic and atraumatic.  Eyes:     General: No scleral icterus.    Conjunctiva/sclera: Conjunctivae normal.     Pupils: Pupils are equal, round, and reactive to light.  Cardiovascular:     Rate and Rhythm: Normal rate and regular rhythm.     Heart sounds: Normal heart sounds.  Pulmonary:     Effort: Pulmonary effort is normal. No respiratory distress.     Breath sounds: Normal breath sounds. No wheezing or rales.     Comments: Diffusely distant bs  Abdominal:     General: Abdomen is flat. Bowel sounds are normal. There is no distension.  Palpations: Abdomen is soft. There is no mass.     Tenderness: There is abdominal tenderness in the epigastric area. There is no right CVA tenderness, left CVA tenderness, guarding or rebound. Negative signs include Murphy's sign and McBurney's sign.     Hernia: No hernia is present.  Musculoskeletal:     Cervical back: Normal range of motion and neck supple.  Lymphadenopathy:     Cervical: No cervical adenopathy.  Skin:    General: Skin is warm and dry.     Coloration: Skin is not pale.     Findings: No erythema.  Neurological:     Mental Status: She is alert.           Assessment & Plan:   Problem List Items Addressed This Visit      Respiratory   Smokers' cough ([place])    Again discussed smoking cessation         Other   Abdominal cramping - Primary    In pt also prone to leg cramps  Disc inc water intake Trial of magnesium Re start mvi and vit D Trial of SL hyoscyamine for longer episodes (disc  Poss side eff) If no improvement consider GI visit       Epigastric burning sensation    In addition to cramping  Suggest trial of pepcid prn- can dose regularly if needed  Also disc things to watch in diet/aware

## 2019-10-14 NOTE — Patient Instructions (Addendum)
Drink 64 oz of fluid a day-mostly water   Start back on multi vitamin and add D 4000 iu  Also try magnesium (helps cramps and constipation)  250 to 500 mg daily   (bedtime)    pepcid as needed for upper abdominal burning   Try hyoscyamine under the tongue (as needed for abdominal cramping) as needed   If symptoms do not improve we may want to send you to GI   If symptoms change let me know  Work on good fruit and vegetable intake    Think about quitting smoking

## 2019-10-16 NOTE — Assessment & Plan Note (Signed)
In pt also prone to leg cramps  Disc inc water intake Trial of magnesium Re start mvi and vit D Trial of SL hyoscyamine for longer episodes (disc  Poss side eff) If no improvement consider GI visit

## 2019-10-16 NOTE — Assessment & Plan Note (Signed)
Again discussed smoking cessation

## 2019-10-16 NOTE — Assessment & Plan Note (Signed)
In addition to cramping  Suggest trial of pepcid prn- can dose regularly if needed  Also disc things to watch in diet/aware

## 2020-05-21 ENCOUNTER — Encounter: Payer: Self-pay | Admitting: Family Medicine

## 2021-06-03 LAB — HM MAMMOGRAPHY

## 2021-06-20 ENCOUNTER — Encounter: Payer: Self-pay | Admitting: Family Medicine

## 2021-09-09 ENCOUNTER — Encounter: Payer: Self-pay | Admitting: Family Medicine

## 2021-09-09 ENCOUNTER — Ambulatory Visit (INDEPENDENT_AMBULATORY_CARE_PROVIDER_SITE_OTHER): Payer: 59 | Admitting: Family Medicine

## 2021-09-09 ENCOUNTER — Other Ambulatory Visit: Payer: Self-pay

## 2021-09-09 VITALS — BP 126/78 | HR 79 | Temp 97.6°F | Ht 62.0 in | Wt 124.2 lb

## 2021-09-09 DIAGNOSIS — E2839 Other primary ovarian failure: Secondary | ICD-10-CM

## 2021-09-09 DIAGNOSIS — J8410 Pulmonary fibrosis, unspecified: Secondary | ICD-10-CM

## 2021-09-09 DIAGNOSIS — E039 Hypothyroidism, unspecified: Secondary | ICD-10-CM

## 2021-09-09 DIAGNOSIS — J410 Simple chronic bronchitis: Secondary | ICD-10-CM | POA: Diagnosis not present

## 2021-09-09 DIAGNOSIS — Z1231 Encounter for screening mammogram for malignant neoplasm of breast: Secondary | ICD-10-CM

## 2021-09-09 DIAGNOSIS — E559 Vitamin D deficiency, unspecified: Secondary | ICD-10-CM | POA: Diagnosis not present

## 2021-09-09 DIAGNOSIS — M8580 Other specified disorders of bone density and structure, unspecified site: Secondary | ICD-10-CM | POA: Diagnosis not present

## 2021-09-09 DIAGNOSIS — Z0000 Encounter for general adult medical examination without abnormal findings: Secondary | ICD-10-CM

## 2021-09-09 DIAGNOSIS — F419 Anxiety disorder, unspecified: Secondary | ICD-10-CM

## 2021-09-09 DIAGNOSIS — F17200 Nicotine dependence, unspecified, uncomplicated: Secondary | ICD-10-CM

## 2021-09-09 DIAGNOSIS — J449 Chronic obstructive pulmonary disease, unspecified: Secondary | ICD-10-CM

## 2021-09-09 MED ORDER — BENZONATATE 200 MG PO CAPS: 200.0000 mg | ORAL_CAPSULE | Freq: Three times a day (TID) | ORAL | 5 refills | Status: DC | PRN

## 2021-09-09 MED ORDER — ALPRAZOLAM 0.25 MG PO TABS: ORAL_TABLET | ORAL | 0 refills | Status: DC

## 2021-09-09 MED ORDER — ALPRAZOLAM 0.25 MG PO TABS: ORAL_TABLET | ORAL | 0 refills | Status: AC

## 2021-09-09 NOTE — Assessment & Plan Note (Signed)
Continues Tessalon as needed ?Has started to cut down smoking but is not ready to quit ?Given information about the CT lung cancer screening program for her to consider ?

## 2021-09-09 NOTE — Assessment & Plan Note (Signed)
Reviewed health habits including diet and exercise and skin cancer prevention ?Reviewed appropriate screening tests for age  ?Also reviewed health mt list, fam hx and immunization status , as well as social and family history   ?See HPI ? labs ordered ?Encourage smoking cessation ?Information given our ED lung cancer screening program, patient will get back to [place] if she wants to begin that process ?Declines zoster vaccine ?Missed flu shot this year but plans to get it in the fall ?Declines Pap and GYN exam today ?Mammogram is up-to-date from 12 of 2022 ?Colonoscopy was 2012 ?

## 2021-09-09 NOTE — Assessment & Plan Note (Signed)
Pt uses symbicort prn currently  ?

## 2021-09-09 NOTE — Assessment & Plan Note (Signed)
No clinical changes 

## 2021-09-09 NOTE — Assessment & Plan Note (Signed)
Disc in detail risks of smoking and possible outcomes including copd, vascular/ heart disease, cancer , respiratory and sinus infections  ?Pt voices understanding ? ?Pt has cut back  ?Not entirely ready to quit  ?

## 2021-09-09 NOTE — Assessment & Plan Note (Signed)
Patient sees endocrinology for this ?Takes levothyroxine 75 mcg daily, this was a recent decrease in dose ?Has follow-up upcoming ?

## 2021-09-09 NOTE — Assessment & Plan Note (Addendum)
Last DEXA was 9 of 2020, so 1 was ordered at [facility] ?No falls or fractures ?Taking vitamin D and a multivitamin daily ?Walking and some yard work for exercise ?Encourage smoking cessation ?

## 2021-09-09 NOTE — Assessment & Plan Note (Signed)
Takes Xanax rarely, this was refilled today ?Encouraged good self-care ?

## 2021-09-09 NOTE — Progress Notes (Signed)
? ?Subjective:  ? ? Patient ID: Regina Ruiz, female    DOB: 08-17-1955, 66 y.o.   MRN: [id number] ? ?This visit occurred during the SARS-CoV-2 public health emergency.  Safety protocols were in place, including screening questions prior to the visit, additional usage of staff PPE, and extensive cleaning of exam room while observing appropriate contact time as indicated for disinfecting solutions.  ? ?HPI ?Here for health maintenance exam and to review chronic medical problems  ? ?Wt Readings from Last 3 Encounters:  ?09/09/21 124 lb 4 oz (56.4 kg)  ?10/14/19 143 lb 2 oz (64.9 kg)  ?12/28/18 145 lb (65.8 kg)  ? ?22.73 kg/m? ? ?Work is crazy  ?Working over 60 hours per week ?Understaffed  ? ?Feeling ok  ?Allergies are problematic ?Runny nose/ears ring  ?Allergra D and flonase  ? ?Smoking cough- chronic  ?Cutting back , down to 1ppd or less / she puts them out of reach to make it harder  ? ?Tried vaping/makes her cough too much  ?Symbicort helps on and off if breathing gets tight   ?? If interested in screening  ? ? ?Some exercise  ? ? ?Needs tessalon refilled  ?Also xanax-needs very rarely  ? ?Zoster status -not interested  ?Flu shot -missed this year /will do in fall  ?Tdap 05/2017  ? ?Pap 02/2016   ?Declines  ?No problems /no symptoms and not sexually active  ? ?Mammogram 05/2021 ?Self breast exam  - no lumps  ? ?Colonoscopy 11/2010 ? ?Dexa 02/2019 ?Osteopenia ?Falls-none  ?Fractures -none  ?Supplements  vit D and also mvi  ?Exercise - walking and some yard work  ? ? ?Hypothyroidism  ?Pt has no clinical changes ?No change in energy level/ hair or skin/ edema and no tremor ?Lab Results  ?Component Value Date  ? TSH 24.02 (H) 06/02/2017  ? Had some weight loss - watching closer  ?For labs soon  ?Sees endocrinology  ?Taking levothyroxine 75 mcg daily -recently went down on dose  ? ? ?No derm care ?No sunburn  ? ?Patient Active Problem List  ? Diagnosis Date Noted  ? Epigastric burning sensation 10/14/2019  ? Estrogen  deficiency 12/28/2018  ? Screening mammogram, encounter for 12/28/2018  ? Postinflammatory pulmonary fibrosis ([place]) 06/29/2017  ? Upper airway cough syndrome 06/29/2017  ? Smokers' cough ([place]) 02/26/2016  ? Encounter for routine gynecological examination 02/26/2016  ? Nocturnal leg cramps 07/12/2013  ? Encounter for annual routine gynecological examination 12/16/2011  ? Sleep disorder 03/21/2011  ? Hypothyroid 02/19/2011  ? Routine general medical examination at a health care facility 10/31/2010  ? Vitamin D deficiency 12/06/2009  ? PULMONARY NODULE 08/30/2008  ? CIGARETTE SMOKER 08/25/2008  ? Anxiety 09/01/2006  ? COPD GOLD II still smoking  09/01/2006  ? Osteopenia 04/16/2006  ? ?Past Medical History:  ?Diagnosis Date  ? Anxiety   ? Colitis   ? COPD (chronic obstructive pulmonary disease) ([place])   ? Depression   ? Osteopenia   ? Thyroid disease   ? ?Past Surgical History:  ?Procedure Laterality Date  ? DILATION AND CURETTAGE OF UTERUS    ? endomet polyp  6/12  ? removed   ? ENDOMETRIAL BIOPSY  6/12  ? Dr Phineas Real- negative  ? HYSTEROSCOPY  6.7.12  ? [facility]: ENDOMETRIAL POLYP  ? ?Social History  ? ?Tobacco Use  ? Smoking status: Every Day  ?  Packs/day: 1.00  ?  Years: 46.00  ?  Pack years: 46.00  ?  Types: Cigarettes  ?  Smokeless tobacco: Never  ?Vaping Use  ? Vaping Use: Never used  ?Substance Use Topics  ? Alcohol use: Yes  ?  Alcohol/week: 0.0 standard drinks  ?  Comment: rare  ? Drug use: No  ? ?Family History  ?Problem Relation Age of Onset  ? Cancer Father   ?     lung cancer and brain cancer  ? Breast cancer Sister   ? Stroke Maternal Grandfather   ? Stroke Paternal Grandfather   ? Heart disease Mother   ? ?Allergies  ?Allergen Reactions  ? Butalbital-Aspirin-Caffeine   ?  REACTION: unspecified  ? Fosamax [Alendronate Sodium]   ?  GI  ? Meperidine Hcl   ?  REACTION: unspecified  ? Methocarbamol Other (See Comments)  ?  Causes Headaches/ Migraines  ? Zolpidem Tartrate   ? ?Current Outpatient Medications on File  Prior to Visit  ?Medication Sig Dispense Refill  ? albuterol (PROVENTIL HFA;VENTOLIN HFA) 108 (90 Base) MCG/ACT inhaler Inhale 2 puffs into the lungs every 4 (four) hours as needed for wheezing. 1 Inhaler 5  ? levothyroxine (SYNTHROID) 75 MCG tablet Take 75 mcg by mouth daily before breakfast.    ? Multiple Vitamins-Minerals (MULTIVITAMIN WITH MINERALS) tablet Take 1 tablet by mouth daily.    ? SUMAtriptan (IMITREX) 100 MG tablet Take 1 pill orally for migraine - if necessary may repeat once in 2 hours 10 tablet 3  ? SYMBICORT 160-4.5 MCG/ACT inhaler INHALE 2 PUFFS INTO THE LUNGS TWICE DAILY 10.2 Inhaler 0  ? ?No current facility-administered medications on file prior to visit.  ?  ?Review of Systems  ?Constitutional:  Positive for fatigue. Negative for activity change, appetite change, fever and unexpected weight change.  ?HENT:  Negative for congestion, ear pain, rhinorrhea, sinus pressure and sore throat.   ?Eyes:  Negative for pain, redness and visual disturbance.  ?Respiratory:  Positive for cough. Negative for shortness of breath and wheezing.   ?Cardiovascular:  Negative for chest pain and palpitations.  ?Gastrointestinal:  Negative for abdominal pain, blood in stool, constipation and diarrhea.  ?Endocrine: Negative for polydipsia and polyuria.  ?Genitourinary:  Negative for dysuria, frequency and urgency.  ?Musculoskeletal:  Negative for arthralgias, back pain and myalgias.  ?Skin:  Negative for pallor and rash.  ?Allergic/Immunologic: Negative for environmental allergies.  ?Neurological:  Negative for dizziness, syncope and headaches.  ?Hematological:  Negative for adenopathy. Does not bruise/bleed easily.  ?Psychiatric/Behavioral:  Negative for decreased concentration and dysphoric mood. The patient is not nervous/anxious.   ? ?   ?Objective:  ? Physical Exam ?Constitutional:   ?   General: She is not in acute distress. ?   Appearance: Normal appearance. She is well-developed and normal weight. She is not  ill-appearing or diaphoretic.  ?HENT:  ?   Head: Normocephalic and atraumatic.  ?   Right Ear: Tympanic membrane, ear canal and external ear normal.  ?   Left Ear: Tympanic membrane, ear canal and external ear normal.  ?   Nose: Nose normal. No congestion.  ?   Mouth/Throat:  ?   Mouth: Mucous membranes are moist.  ?   Pharynx: Oropharynx is clear. No posterior oropharyngeal erythema.  ?Eyes:  ?   General: No scleral icterus. ?   Extraocular Movements: Extraocular movements intact.  ?   Conjunctiva/sclera: Conjunctivae normal.  ?   Pupils: Pupils are equal, round, and reactive to light.  ?Neck:  ?   Thyroid: No thyromegaly.  ?   Vascular: No carotid bruit or  JVD.  ?Cardiovascular:  ?   Rate and Rhythm: Normal rate and regular rhythm.  ?   Pulses: Normal pulses.  ?   Heart sounds: Normal heart sounds.  ?  No gallop.  ?Pulmonary:  ?   Effort: Pulmonary effort is normal. No respiratory distress.  ?   Breath sounds: Normal breath sounds. No wheezing.  ?   Comments: Good air exch ?Chest:  ?   Chest wall: No tenderness.  ?Abdominal:  ?   General: Bowel sounds are normal. There is no distension or abdominal bruit.  ?   Palpations: Abdomen is soft. There is no mass.  ?   Tenderness: There is no abdominal tenderness.  ?   Hernia: No hernia is present.  ?Genitourinary: ?   Comments: Breast exam: No mass, nodules, thickening, tenderness, bulging, retraction, inflamation, nipple discharge or skin changes noted.  No axillary or clavicular LA.     ?Musculoskeletal:     ?   General: No tenderness. Normal range of motion.  ?   Cervical back: Normal range of motion and neck supple. No rigidity. No muscular tenderness.  ?   Right lower leg: No edema.  ?   Left lower leg: No edema.  ?   Comments: No kyphosis   ?Lymphadenopathy:  ?   Cervical: No cervical adenopathy.  ?Skin: ?   General: Skin is warm and dry.  ?   Coloration: Skin is not pale.  ?   Findings: No erythema or rash.  ?   Comments: Solar lentigines diffusely ?   ?Neurological:  ?   Mental Status: She is alert. Mental status is at baseline.  ?   Cranial Nerves: No cranial nerve deficit.  ?   Motor: No abnormal muscle tone.  ?   Coordination: Coordination normal.  ?   Gait:

## 2021-09-09 NOTE — Patient Instructions (Addendum)
If you want to enroll in the lung cancer screening program let us know  ?Keep working on quitting smoking  ? ?Get your flu shot in the fall  ? ?Eat a healthy balanced diet  ? ?Use sun protection  ? ?I will put the bone density order in  ? ?Call to schedule at [place]  ? ?Please call the location of your choice from the menu below to schedule your Mammogram and/or Bone Density appointment.   ? ?[place]  ? ?[facility]                ?      Phone:  [phone] ?[address]                               ?[place], [place] [address]                                                             ?Services: Traditional and 3D Mammogram, Bone Density  ? ?[facility]           ?      Phone: [phone] ?[address]                                                       ?[place], [place] [address]    ?Service: Bone Density ONLY  ? *this site does NOT perform mammograms ? ?[facility]                       ? Phone:  [phone] ?[address]                                  ?[place], [place] [address]                                            ?Services:  3D Mammogram and Bone Density  ? ? ?[place] ? ?[facility]   ?Phone:  [phone]   ?[place], [place] [address]                                            ?Services: 3D Mammogram and Bone Density ? ?[facility])  ?Phone:  [phone]   ?[address]                        ?[place],  [address]                                              ?Services:  3D Mammogram and Bone Density ? ? ? ? ?

## 2021-09-10 LAB — COMPREHENSIVE METABOLIC PANEL
ALT: 9 U/L (ref 0–35)
AST: 13 U/L (ref 0–37)
Albumin: 4.3 g/dL (ref 3.5–5.2)
Alkaline Phosphatase: 107 U/L (ref 39–117)
BUN: 10 mg/dL (ref 6–23)
CO2: 32 mEq/L (ref 19–32)
Calcium: 9.1 mg/dL (ref 8.4–10.5)
Chloride: 100 mEq/L (ref 96–112)
Creatinine, Ser: 0.79 mg/dL (ref 0.40–1.20)
GFR: 79.14 mL/min (ref >60.00)
Glucose, Bld: 89 mg/dL (ref 70–99)
Potassium: 4.4 mEq/L (ref 3.5–5.1)
Sodium: 138 mEq/L (ref 135–145)
Total Bilirubin: 0.4 mg/dL (ref 0.2–1.2)
Total Protein: 6.6 g/dL (ref 6.0–8.3)

## 2021-09-10 LAB — VITAMIN D 25 HYDROXY (VIT D DEFICIENCY, FRACTURES): VITD: 42.63 ng/mL (ref 30.00–100.00)

## 2021-09-10 LAB — LIPID PANEL
Cholesterol: 153 mg/dL (ref 0–200)
HDL: 48.4 mg/dL (ref >39.00)
LDL Cholesterol: 79 mg/dL (ref 0–99)
NonHDL: 104.74
Total CHOL/HDL Ratio: 3
Triglycerides: 127 mg/dL (ref 0.0–149.0)
VLDL: 25.4 mg/dL (ref 0.0–40.0)

## 2021-09-10 LAB — CBC WITH DIFFERENTIAL/PLATELET
Basophils Absolute: 0.1 10*3/uL (ref 0.0–0.1)
Basophils Relative: 0.7 % (ref 0.0–3.0)
Eosinophils Absolute: 0.1 10*3/uL (ref 0.0–0.7)
Eosinophils Relative: 1 % (ref 0.0–5.0)
HCT: 41.9 % (ref 36.0–46.0)
Hemoglobin: 14.3 g/dL (ref 12.0–15.0)
Lymphocytes Relative: 24.2 % (ref 12.0–46.0)
Lymphs Abs: 1.8 10*3/uL (ref 0.7–4.0)
MCHC: 34.1 g/dL (ref 30.0–36.0)
MCV: 90.7 fl (ref 78.0–100.0)
Monocytes Absolute: 0.7 10*3/uL (ref 0.1–1.0)
Monocytes Relative: 9.9 % (ref 3.0–12.0)
Neutro Abs: 4.7 10*3/uL (ref 1.4–7.7)
Neutrophils Relative %: 64.2 % (ref 43.0–77.0)
Platelets: 281 10*3/uL (ref 150.0–400.0)
RBC: 4.62 Mil/uL (ref 3.87–5.11)
RDW: 12.5 % (ref 11.5–15.5)
WBC: 7.4 10*3/uL (ref 4.0–10.5)

## 2021-09-10 LAB — TSH: TSH: 1.39 u[IU]/mL (ref 0.35–5.50)

## 2021-09-27 ENCOUNTER — Telehealth: Payer: Self-pay | Admitting: *Deleted

## 2021-09-27 NOTE — Telephone Encounter (Signed)
Per Dr. Glori Bickers pt is due for colon cancer screening. Sent mychart message asking pt what she wants to do (colonoscopy vs cologuard) ?

## 2022-02-27 ENCOUNTER — Ambulatory Visit (INDEPENDENT_AMBULATORY_CARE_PROVIDER_SITE_OTHER): Payer: 59 | Admitting: Family Medicine

## 2022-02-27 ENCOUNTER — Encounter: Payer: Self-pay | Admitting: Family Medicine

## 2022-02-27 VITALS — BP 104/64 | HR 75 | Temp 97.9°F | Ht 62.0 in | Wt 127.0 lb

## 2022-02-27 DIAGNOSIS — F17200 Nicotine dependence, unspecified, uncomplicated: Secondary | ICD-10-CM | POA: Diagnosis not present

## 2022-02-27 DIAGNOSIS — J301 Allergic rhinitis due to pollen: Secondary | ICD-10-CM

## 2022-02-27 DIAGNOSIS — R221 Localized swelling, mass and lump, neck: Secondary | ICD-10-CM | POA: Insufficient documentation

## 2022-02-27 DIAGNOSIS — R21 Rash and other nonspecific skin eruption: Secondary | ICD-10-CM | POA: Diagnosis not present

## 2022-02-27 DIAGNOSIS — J309 Allergic rhinitis, unspecified: Secondary | ICD-10-CM | POA: Insufficient documentation

## 2022-02-27 MED ORDER — KETOCONAZOLE 2 % EX CREA: 1.0000 | TOPICAL_CREAM | Freq: Two times a day (BID) | CUTANEOUS | 0 refills | Status: AC | PRN

## 2022-02-27 NOTE — Progress Notes (Signed)
Subjective:    Patient ID: Regina Ruiz, female    DOB: Oct 24, 1955, 66 y.o.   MRN: [id number]  HPI Pt presents with rash and nasal congestion   Wt Readings from Last 3 Encounters:  02/27/22 127 lb (57.6 kg)  09/09/21 124 lb 4 oz (56.4 kg)  10/14/19 143 lb 2 oz (64.9 kg)   23.23 kg/m   Rash for 1 1/2 weeks  Itchy   Back and abdomen   Used moisturizer  Then hydrocortisone   Not swimming  Not in the sun No new products or medicines   Nasal congestion  Lot of pnd /phlegm Feels like there is a knot in r neck   No ST  Patient Active Problem List   Diagnosis Date Noted   Rash and nonspecific skin eruption 02/27/2022   Allergic rhinitis 02/27/2022   Localized swelling, mass or lump of neck 02/27/2022   Epigastric burning sensation 10/14/2019   Estrogen deficiency 12/28/2018   Screening mammogram, encounter for 12/28/2018   Postinflammatory pulmonary fibrosis ([place]) 06/29/2017   Upper airway cough syndrome 06/29/2017   Smokers' cough ([place]) 02/26/2016   Encounter for routine gynecological examination 02/26/2016   Nocturnal leg cramps 07/12/2013   Encounter for annual routine gynecological examination 12/16/2011   Sleep disorder 03/21/2011   Hypothyroid 02/19/2011   Routine general medical examination at a health care facility 10/31/2010   Vitamin D deficiency 12/06/2009   PULMONARY NODULE 08/30/2008   CIGARETTE SMOKER 08/25/2008   Anxiety 09/01/2006   COPD GOLD II still smoking  09/01/2006   Osteopenia 04/16/2006   Past Medical History:  Diagnosis Date   Anxiety    Colitis    COPD (chronic obstructive pulmonary disease) ([place])    Depression    Osteopenia    Thyroid disease    Past Surgical History:  Procedure Laterality Date   DILATION AND CURETTAGE OF UTERUS     endomet polyp  6/12   removed    ENDOMETRIAL BIOPSY  6/12   Dr Phineas Real- negative   HYSTEROSCOPY  6.7.12   [facility]: ENDOMETRIAL POLYP   Social History   Tobacco Use   Smoking status: Every  Day    Packs/day: 1.00    Years: 46.00    Total pack years: 46.00    Types: Cigarettes   Smokeless tobacco: Never  Vaping Use   Vaping Use: Never used  Substance Use Topics   Alcohol use: Yes    Alcohol/week: 0.0 standard drinks of alcohol    Comment: rare   Drug use: No   Family History  Problem Relation Age of Onset   Cancer Father        lung cancer and brain cancer   Breast cancer Sister    Stroke Maternal Grandfather    Stroke Paternal Grandfather    Heart disease Mother    Allergies  Allergen Reactions   Butalbital-Aspirin-Caffeine     REACTION: unspecified   Fosamax [Alendronate Sodium]     GI   Meperidine Hcl     REACTION: unspecified   Methocarbamol Other (See Comments)    Causes Headaches/ Migraines   Zolpidem Tartrate    Current Outpatient Medications on File Prior to Visit  Medication Sig Dispense Refill   albuterol (PROVENTIL HFA;VENTOLIN HFA) 108 (90 Base) MCG/ACT inhaler Inhale 2 puffs into the lungs every 4 (four) hours as needed for wheezing. 1 Inhaler 5   ALPRAZolam (XANAX) 0.25 MG tablet TAKE 1 TAB BY MOUTH 2 TIMES DAILY AS NEEDED 60 tablet 0  benzonatate (TESSALON) 200 MG capsule Take 1 capsule (200 mg total) by mouth 3 (three) times daily as needed. 30 capsule 5   levothyroxine (SYNTHROID) 75 MCG tablet Take 75 mcg by mouth daily before breakfast.     Multiple Vitamins-Minerals (MULTIVITAMIN WITH MINERALS) tablet Take 1 tablet by mouth daily.     SUMAtriptan (IMITREX) 100 MG tablet Take 1 pill orally for migraine - if necessary may repeat once in 2 hours 10 tablet 3   SYMBICORT 160-4.5 MCG/ACT inhaler INHALE 2 PUFFS INTO THE LUNGS TWICE DAILY 10.2 Inhaler 0   No current facility-administered medications on file prior to visit.    Review of Systems  Constitutional:  Negative for activity change, appetite change, fatigue, fever and unexpected weight change.  HENT:  Positive for postnasal drip, rhinorrhea and sneezing. Negative for congestion, ear  pain, facial swelling, sinus pressure, sore throat and voice change.   Eyes:  Negative for pain, redness and visual disturbance.  Respiratory:  Negative for cough, shortness of breath and wheezing.   Cardiovascular:  Negative for chest pain and palpitations.  Gastrointestinal:  Negative for abdominal pain, blood in stool, constipation and diarrhea.  Endocrine: Negative for polydipsia and polyuria.  Genitourinary:  Negative for dysuria, frequency and urgency.  Musculoskeletal:  Negative for arthralgias, back pain and myalgias.  Skin:  Positive for rash. Negative for pallor and wound.  Allergic/Immunologic: Negative for environmental allergies.  Neurological:  Negative for dizziness, syncope and headaches.  Hematological:  Negative for adenopathy. Does not bruise/bleed easily.  Psychiatric/Behavioral:  Negative for decreased concentration and dysphoric mood. The patient is not nervous/anxious.        Objective:   Physical Exam Constitutional:      General: She is not in acute distress.    Appearance: Normal appearance. She is normal weight. She is not ill-appearing or diaphoretic.  HENT:     Head: Normocephalic and atraumatic.     Right Ear: Tympanic membrane, ear canal and external ear normal.     Left Ear: Tympanic membrane, ear canal and external ear normal.     Nose: Congestion and rhinorrhea present.     Comments: Boggy nares     Mouth/Throat:     Mouth: Mucous membranes are moist.     Pharynx: Oropharynx is clear. No oropharyngeal exudate or posterior oropharyngeal erythema.  Eyes:     General:        Right eye: No discharge.        Left eye: No discharge.     Conjunctiva/sclera: Conjunctivae normal.     Pupils: Pupils are equal, round, and reactive to light.  Neck:     Vascular: No carotid bruit.     Comments: Area of fullness posterior R cervical area -resembles edge of tight muscle  Not mobile or discreet Non tender   No adenopathy noted  Cardiovascular:     Rate  and Rhythm: Normal rate and regular rhythm.     Heart sounds: Normal heart sounds.  Pulmonary:     Effort: Pulmonary effort is normal. No respiratory distress.     Breath sounds: Normal breath sounds. No wheezing.     Comments: Diffusely distant bs  Musculoskeletal:     Cervical back: Neck supple. No tenderness.  Lymphadenopathy:     Cervical: No cervical adenopathy.  Skin:    General: Skin is warm.     Comments: Mildly erythematous raised rash under breasts and small amt under pannus and on low back  No vesicles or  pustules or scale   Neurological:     Mental Status: She is alert.     Cranial Nerves: No cranial nerve deficit.     Motor: No weakness.  Psychiatric:        Mood and Affect: Mood normal.           Assessment & Plan:   Problem List Items Addressed This Visit       Respiratory   Allergic rhinitis    Runny nose and congestion  inst to try zyrtec 10 mg instead of claritin  flonase once daily 2 sprays  Avoid pollen when able Update if not improving         Musculoskeletal and Integument   Rash and nonspecific skin eruption - Primary    Mild erythematous itchy rash under breasts and low abdomen/groin that did not improve with hydrocortisone cream Given area- may be intertrigo/candida   Px ketoconazole cream Handout given inst to keep area clean and cool/dry  Update if not starting to improve in a week or if worsening           Other   CIGARETTE SMOKER    Encouraged smoking cessation  Pt does not feel ready at this time      Localized swelling, mass or lump of neck    Full area R posterior cervical seems more like a muscle knot than a discreet mass  Recommend she monitor and try heat and stretching  F/u in a month for a re check  inst to call if this enlarges or becomes symptomatic in the meantime

## 2022-02-27 NOTE — Assessment & Plan Note (Signed)
Full area R posterior cervical seems more like a muscle knot than a discreet mass  Recommend she monitor and try heat and stretching  F/u in a month for a re check  inst to call if this enlarges or becomes symptomatic in the meantime

## 2022-02-27 NOTE — Patient Instructions (Addendum)
Try zyrtec 10 mg daily (instead of allegra D) Get a store brand of it / it is cheaper  Use flonase daily also   If needed nasal saline spray helps congestion also   Avoid pollen if you can   If symptoms worsen or you get a fever let me know   I think you may have a yeast (fungal) rash  Try the ketoconazole cream  Keep areas as clean and dry as you can

## 2022-02-27 NOTE — Assessment & Plan Note (Signed)
Mild erythematous itchy rash under breasts and low abdomen/groin that did not improve with hydrocortisone cream Given area- may be intertrigo/candida   Px ketoconazole cream Handout given inst to keep area clean and cool/dry  Update if not starting to improve in a week or if worsening

## 2022-02-27 NOTE — Assessment & Plan Note (Signed)
Runny nose and congestion  inst to try zyrtec 10 mg instead of claritin  flonase once daily 2 sprays  Avoid pollen when able Update if not improving

## 2022-02-27 NOTE — Assessment & Plan Note (Signed)
Encouraged smoking cessation  Pt does not feel ready at this time

## 2022-04-03 ENCOUNTER — Ambulatory Visit (INDEPENDENT_AMBULATORY_CARE_PROVIDER_SITE_OTHER)
Admission: RE | Admit: 2022-04-03 | Discharge: 2022-04-03 | Disposition: A | Discharge status: Home / Self Care | Payer: 59 | Source: Ambulatory Visit | Attending: Family Medicine | Admitting: Family Medicine

## 2022-04-03 ENCOUNTER — Encounter: Payer: Self-pay | Admitting: Family Medicine

## 2022-04-03 ENCOUNTER — Ambulatory Visit (INDEPENDENT_AMBULATORY_CARE_PROVIDER_SITE_OTHER): Payer: 59 | Admitting: Family Medicine

## 2022-04-03 ENCOUNTER — Ambulatory Visit: Payer: 59 | Admitting: Family Medicine

## 2022-04-03 VITALS — BP 132/64 | HR 68 | Temp 97.7°F | Ht 62.0 in | Wt 131.2 lb

## 2022-04-03 DIAGNOSIS — J410 Simple chronic bronchitis: Secondary | ICD-10-CM

## 2022-04-03 DIAGNOSIS — F17200 Nicotine dependence, unspecified, uncomplicated: Secondary | ICD-10-CM | POA: Diagnosis not present

## 2022-04-03 DIAGNOSIS — F17210 Nicotine dependence, cigarettes, uncomplicated: Secondary | ICD-10-CM

## 2022-04-03 DIAGNOSIS — R221 Localized swelling, mass and lump, neck: Secondary | ICD-10-CM

## 2022-04-03 LAB — BASIC METABOLIC PANEL
BUN: 12 mg/dL (ref 6–23)
CO2: 35 mEq/L — ABNORMAL HIGH (ref 19–32)
Calcium: 9.1 mg/dL (ref 8.4–10.5)
Chloride: 103 mEq/L (ref 96–112)
Creatinine, Ser: 0.99 mg/dL (ref 0.40–1.20)
GFR: 60.13 mL/min (ref >60.00)
Glucose, Bld: 72 mg/dL (ref 70–99)
Potassium: 4.7 mEq/L (ref 3.5–5.1)
Sodium: 141 mEq/L (ref 135–145)

## 2022-04-03 LAB — CBC WITH DIFFERENTIAL/PLATELET
Basophils Absolute: 0.1 10*3/uL (ref 0.0–0.1)
Basophils Relative: 0.8 % (ref 0.0–3.0)
Eosinophils Absolute: 0.1 10*3/uL (ref 0.0–0.7)
Eosinophils Relative: 0.9 % (ref 0.0–5.0)
HCT: 43.3 % (ref 36.0–46.0)
Hemoglobin: 14.5 g/dL (ref 12.0–15.0)
Lymphocytes Relative: 20.9 % (ref 12.0–46.0)
Lymphs Abs: 1.9 10*3/uL (ref 0.7–4.0)
MCHC: 33.6 g/dL (ref 30.0–36.0)
MCV: 91 fl (ref 78.0–100.0)
Monocytes Absolute: 0.7 10*3/uL (ref 0.1–1.0)
Monocytes Relative: 8 % (ref 3.0–12.0)
Neutro Abs: 6.5 10*3/uL (ref 1.4–7.7)
Neutrophils Relative %: 69.4 % (ref 43.0–77.0)
Platelets: 296 10*3/uL (ref 150.0–400.0)
RBC: 4.76 Mil/uL (ref 3.87–5.11)
RDW: 12.6 % (ref 11.5–15.5)
WBC: 9.3 10*3/uL (ref 4.0–10.5)

## 2022-04-03 NOTE — Assessment & Plan Note (Signed)
Again enc to consider CT lung cancer screen program and given info Disc in detail risks of smoking and possible outcomes including copd, vascular/ heart disease, cancer , respiratory and sinus infections  Pt voices understanding  She is gradually cutting back but not ready to quit Known copd and smokers cough  cxr planned

## 2022-04-03 NOTE — Assessment & Plan Note (Signed)
Ongoing  Phlegm noted  Is trying to cut back   Uses allergy medicines also  cxr ordered

## 2022-04-03 NOTE — Assessment & Plan Note (Signed)
Small area of thickening on R posterior neck  No change from prev In setting of muscular tightness Also a smoker   Cbc, cmet and cxr today  Consider imaging

## 2022-04-03 NOTE — Progress Notes (Signed)
Subjective:    Patient ID: Regina Ruiz, female    DOB: [birth year], 66 y.o.   MRN: [id number]  HPI Here for re check of lump in neck   Wt Readings from Last 3 Encounters:  04/03/22 131 lb 4 oz (59.5 kg)  02/27/22 127 lb (57.6 kg)  09-26-21 124 lb 4 oz (56.4 kg)   24.01 kg/m   Last month, noted full area in R posterior cervical area  ? If it was a muscle knot vs mass   Thinks it wend down a bit  Her neck is sore in general   Phlegm in throat all the time  Takes allergy medicines  No heartburn   No ear pain  Has some chronic ringing in her ears     Some leg cramping at night  500 mg of mag daily   Lab Results  Component Value Date   CREATININE 0.79 26-Sep-2021   BUN 10 09/26/2021   NA 138 September 26, 2021   K 4.4 Sep 26, 2021   CL 100 09-26-2021   CO2 32 09/26/2021     Father died of lung cancer in 1989/09/13    Lab Results  Component Value Date   WBC 7.4 Sep 26, 2021   HGB 14.3 26-Sep-2021   HCT 41.9 September 26, 2021   MCV 90.7 26-Sep-2021   PLT 281.0 Sep 26, 2021    Smoking - has backed off a bit  About 15 per day  Trying to carry less and cut gradually  More ready than she was /giving it more though   Takes zyrtec  Flonase   Patient Active Problem List   Diagnosis Date Noted   Rash and nonspecific skin eruption 02/27/2022   Allergic rhinitis 02/27/2022   Localized swelling, mass or lump of neck 02/27/2022   Epigastric burning sensation 10/14/2019   Estrogen deficiency 12/28/2018   Screening mammogram, encounter for 12/28/2018   Postinflammatory pulmonary fibrosis ([place]) 06/29/2017   Upper airway cough syndrome 06/29/2017   Smokers' cough ([place]) 02/26/2016   Encounter for routine gynecological examination 02/26/2016   Nocturnal leg cramps 07/12/2013   Encounter for annual routine gynecological examination 12/16/2011   Sleep disorder 03/21/2011   Hypothyroid 02/19/2011   Routine general medical examination at a health care facility 10/31/2010   Vitamin D  deficiency 12/06/2009   PULMONARY NODULE 08/30/2008   CIGARETTE SMOKER 08/25/2008   Anxiety 09/01/2006   COPD GOLD II still smoking  09/01/2006   Osteopenia 04/16/2006   Past Medical History:  Diagnosis Date   Anxiety    Colitis    COPD (chronic obstructive pulmonary disease) ([place])    Depression    Osteopenia    Thyroid disease    Past Surgical History:  Procedure Laterality Date   DILATION AND CURETTAGE OF UTERUS     endomet polyp  6/12   removed    ENDOMETRIAL BIOPSY  6/12   Dr Phineas Real- negative   HYSTEROSCOPY  6.7.12   [facility]: ENDOMETRIAL POLYP   Social History   Tobacco Use   Smoking status: Every Day    Packs/day: 1.00    Years: 46.00    Total pack years: 46.00    Types: Cigarettes   Smokeless tobacco: Never  Vaping Use   Vaping Use: Never used  Substance Use Topics   Alcohol use: Yes    Alcohol/week: 0.0 standard drinks of alcohol    Comment: rare   Drug use: No   Family History  Problem Relation Age of Onset   Cancer Father  lung cancer and brain cancer   Breast cancer Sister    Stroke Maternal Grandfather    Stroke Paternal Grandfather    Heart disease Mother    Allergies  Allergen Reactions   Butalbital-Aspirin-Caffeine     REACTION: unspecified   Fosamax [Alendronate Sodium]     GI   Meperidine Hcl     REACTION: unspecified   Methocarbamol Other (See Comments)    Causes Headaches/ Migraines   Zolpidem Tartrate    Current Outpatient Medications on File Prior to Visit  Medication Sig Dispense Refill   albuterol (PROVENTIL HFA;VENTOLIN HFA) 108 (90 Base) MCG/ACT inhaler Inhale 2 puffs into the lungs every 4 (four) hours as needed for wheezing. 1 Inhaler 5   ALPRAZolam (XANAX) 0.25 MG tablet TAKE 1 TAB BY MOUTH 2 TIMES DAILY AS NEEDED 60 tablet 0   benzonatate (TESSALON) 200 MG capsule Take 1 capsule (200 mg total) by mouth 3 (three) times daily as needed. 30 capsule 5   ketoconazole (NIZORAL) 2 % cream Apply 1 Application topically 2  (two) times daily as needed for irritation (rash). To affected rash areas 30 g 0   levothyroxine (SYNTHROID) 75 MCG tablet Take 75 mcg by mouth daily before breakfast.     Multiple Vitamins-Minerals (MULTIVITAMIN WITH MINERALS) tablet Take 1 tablet by mouth daily.     SUMAtriptan (IMITREX) 100 MG tablet Take 1 pill orally for migraine - if necessary may repeat once in 2 hours 10 tablet 3   SYMBICORT 160-4.5 MCG/ACT inhaler INHALE 2 PUFFS INTO THE LUNGS TWICE DAILY 10.2 Inhaler 0   No current facility-administered medications on file prior to visit.    Review of Systems  Constitutional:  Negative for activity change, appetite change, fatigue, fever and unexpected weight change.  HENT:  Negative for congestion, ear pain, rhinorrhea, sinus pressure and sore throat.   Eyes:  Negative for pain, redness and visual disturbance.  Respiratory:  Positive for cough. Negative for shortness of breath and wheezing.   Cardiovascular:  Negative for chest pain and palpitations.  Gastrointestinal:  Negative for abdominal pain, blood in stool, constipation and diarrhea.  Endocrine: Negative for polydipsia and polyuria.  Genitourinary:  Negative for dysuria, frequency and urgency.  Musculoskeletal:  Negative for arthralgias, back pain and myalgias.  Skin:  Negative for pallor and rash.  Allergic/Immunologic: Negative for environmental allergies.  Neurological:  Negative for dizziness, syncope and headaches.  Hematological:  Negative for adenopathy. Does not bruise/bleed easily.  Psychiatric/Behavioral:  Negative for decreased concentration and dysphoric mood. The patient is not nervous/anxious.        Objective:   Physical Exam Constitutional:      General: She is not in acute distress.    Appearance: Normal appearance. She is well-developed and normal weight. She is not ill-appearing or diaphoretic.  HENT:     Head: Normocephalic and atraumatic.     Right Ear: Tympanic membrane, ear canal and external  ear normal.     Left Ear: Tympanic membrane, ear canal and external ear normal.     Nose: Nose normal.     Mouth/Throat:     Mouth: Mucous membranes are moist.     Pharynx: Oropharynx is clear. No oropharyngeal exudate or posterior oropharyngeal erythema.  Eyes:     General: No scleral icterus.       Right eye: No discharge.        Left eye: No discharge.     Conjunctiva/sclera: Conjunctivae normal.     Pupils: Pupils  are equal, round, and reactive to light.  Neck:     Thyroid: No thyromegaly.     Vascular: No carotid bruit or JVD.     Comments: Area of thickness palpated in R posterior cervical area  Mobile and in approx with some tight musculature  Cardiovascular:     Rate and Rhythm: Normal rate and regular rhythm.     Pulses: Normal pulses.     Heart sounds: Normal heart sounds.     No gallop.  Pulmonary:     Effort: Pulmonary effort is normal. No respiratory distress.     Breath sounds: Normal breath sounds. No wheezing or rales.  Abdominal:     General: There is no distension or abdominal bruit.     Palpations: Abdomen is soft.  Musculoskeletal:        General: No swelling or tenderness.     Cervical back: Normal range of motion and neck supple. No rigidity or tenderness.     Right lower leg: No edema.     Left lower leg: No edema.  Skin:    General: Skin is warm and dry.     Coloration: Skin is not pale.     Findings: No rash.  Neurological:     Mental Status: She is alert.     Cranial Nerves: No cranial nerve deficit.     Motor: No weakness.     Coordination: Coordination normal.     Deep Tendon Reflexes: Reflexes are normal and symmetric. Reflexes normal.  Psychiatric:        Mood and Affect: Mood normal.           Assessment & Plan:   Problem List Items Addressed This Visit       Respiratory   Smokers' cough ([place])    Ongoing  Phlegm noted  Is trying to cut back   Uses allergy medicines also  cxr ordered       Relevant Orders   DG Chest 2  View     Other   CIGARETTE SMOKER    Again enc to consider CT lung cancer screen program and given info Disc in detail risks of smoking and possible outcomes including copd, vascular/ heart disease, cancer , respiratory and sinus infections  Pt voices understanding  She is gradually cutting back but not ready to quit Known copd and smokers cough  cxr planned       Relevant Orders   DG Chest 2 View   Localized swelling, mass or lump of neck - Primary    Small area of thickening on R posterior neck  No change from prev In setting of muscular tightness Also a smoker   Cbc, cmet and cxr today  Consider imaging        Relevant Orders   Basic metabolic panel   CBC with Differential/Platelet

## 2022-04-03 NOTE — Patient Instructions (Addendum)
Try a spoon of mustard daily at bedtime for cramps  Let me know when you want a referral for lung cancer screening (I think you would be a candidate)  Let's check a chest xray today  Let's check a CT scan for ultrasound or the lump in your neck' Let's get labs first Then we will reach out to schedule

## 2022-04-06 ENCOUNTER — Encounter: Payer: Self-pay | Admitting: Family Medicine

## 2022-04-06 DIAGNOSIS — R9389 Abnormal findings on diagnostic imaging of other specified body structures: Secondary | ICD-10-CM

## 2022-04-07 ENCOUNTER — Encounter: Payer: Self-pay | Admitting: Family Medicine

## 2022-04-07 DIAGNOSIS — J410 Simple chronic bronchitis: Secondary | ICD-10-CM

## 2022-04-07 DIAGNOSIS — R9389 Abnormal findings on diagnostic imaging of other specified body structures: Secondary | ICD-10-CM | POA: Insufficient documentation

## 2022-04-07 NOTE — Telephone Encounter (Signed)
I ordered a CT chest for her  I did not mark urgent but she would like to have it Thursday or Friday if possible  Would you let the referral team/chat know? If they want me to mark urgent I can  Thanks

## 2022-04-07 NOTE — Telephone Encounter (Signed)
Regina Ruiz is aware and is working on this referral

## 2022-04-08 NOTE — Telephone Encounter (Signed)
Order changed to STAT  Patient made aware to contact [facility] to reschedule.

## 2022-04-08 NOTE — Telephone Encounter (Signed)
I called and spoke with [facility] - the order was scheduled first available slot because it was marked as "routine" priority, not urgent.   They do not have any urgent appt slots in [place]. They did say they have two slots in [place] open for tomorrow 04/09/22 and two slots on Friday in [place] 04/11/22, but nothing in [place].   In order to get an urgent appt in [place] the order will need to be changed to URGENT and the patient can then call [phone] and they will give a location she can walk in to for a scan this week.   Dr Glori Bickers, Please advise, thanks.

## 2022-04-08 NOTE — Addendum Note (Signed)
Addended by: Virl Cagey on: 04/08/2022 10:12 AM   Modules accepted: Orders

## 2022-04-08 NOTE — Telephone Encounter (Addendum)
Per [place] PCP would need to change it to STAT if she wants it scheduled any sooner.

## 2022-04-11 ENCOUNTER — Ambulatory Visit
Admission: RE | Admit: 2022-04-11 | Discharge: 2022-04-11 | Disposition: A | Discharge status: Home / Self Care | Payer: 59 | Source: Ambulatory Visit | Attending: Family Medicine | Admitting: Family Medicine

## 2022-04-11 DIAGNOSIS — R9389 Abnormal findings on diagnostic imaging of other specified body structures: Secondary | ICD-10-CM

## 2022-05-07 ENCOUNTER — Other Ambulatory Visit: Payer: 59

## 2022-06-17 ENCOUNTER — Telehealth: Payer: Self-pay | Admitting: Physician Assistant

## 2022-06-17 ENCOUNTER — Encounter: Payer: Self-pay | Admitting: Family Medicine

## 2022-06-17 DIAGNOSIS — U071 COVID-19: Secondary | ICD-10-CM

## 2022-06-17 MED ORDER — ALBUTEROL SULFATE HFA 108 (90 BASE) MCG/ACT IN AERS: 2.0000 | INHALATION_SPRAY | Freq: Four times a day (QID) | RESPIRATORY_TRACT | 0 refills | Status: DC | PRN

## 2022-06-17 MED ORDER — NIRMATRELVIR/RITONAVIR (PAXLOVID)TABLET: 3.0000 | ORAL_TABLET | Freq: Two times a day (BID) | ORAL | 0 refills | Status: AC

## 2022-06-17 MED ORDER — BENZONATATE 100 MG PO CAPS: 100.0000 mg | ORAL_CAPSULE | Freq: Three times a day (TID) | ORAL | 0 refills | Status: DC | PRN

## 2022-06-17 NOTE — Patient Instructions (Signed)
Arvin Collard, thank you for joining [name], PA-C for today's virtual visit.  While this provider is not your primary care provider (PCP), if your PCP is located in our provider database this encounter information will be shared with them immediately following your visit.   [facility] account gives you access to today's visit and all your visits, tests, and labs performed at [facility] " click here if you don't have a [facility] account or go to [email or web address]  Consent: (Patient) Regina Ruiz provided verbal consent for this virtual visit at the beginning of the encounter.  Current Medications:  Current Outpatient Medications:    albuterol (PROVENTIL HFA;VENTOLIN HFA) 108 (90 Base) MCG/ACT inhaler, Inhale 2 puffs into the lungs every 4 (four) hours as needed for wheezing., Disp: 1 Inhaler, Rfl: 5   ALPRAZolam (XANAX) 0.25 MG tablet, TAKE 1 TAB BY MOUTH 2 TIMES DAILY AS NEEDED, Disp: 60 tablet, Rfl: 0   benzonatate (TESSALON) 200 MG capsule, Take 1 capsule (200 mg total) by mouth 3 (three) times daily as needed., Disp: 30 capsule, Rfl: 5   ketoconazole (NIZORAL) 2 % cream, Apply 1 Application topically 2 (two) times daily as needed for irritation (rash). To affected rash areas, Disp: 30 g, Rfl: 0   levothyroxine (SYNTHROID) 75 MCG tablet, Take 75 mcg by mouth daily before breakfast., Disp: , Rfl:    Multiple Vitamins-Minerals (MULTIVITAMIN WITH MINERALS) tablet, Take 1 tablet by mouth daily., Disp: , Rfl:    SUMAtriptan (IMITREX) 100 MG tablet, Take 1 pill orally for migraine - if necessary may repeat once in 2 hours, Disp: 10 tablet, Rfl: 3   SYMBICORT 160-4.5 MCG/ACT inhaler, INHALE 2 PUFFS INTO THE LUNGS TWICE DAILY, Disp: 10.2 Inhaler, Rfl: 0   Medications ordered in this encounter:  No orders of the defined types were placed in this encounter.    *If you need refills on other medications prior to your next appointment, please  contact your pharmacy*  Follow-Up: Call back or seek an in-person evaluation if the symptoms worsen or if the condition fails to improve as anticipated.  [place] [phone]  Other Instructions Please keep well-hydrated and get plenty of rest. Start a saline nasal rinse to flush out your nasal passages. You can use plain Mucinex to help thin congestion. If you have a humidifier, running in the bedroom at night. I want you to start OTC vitamin D3 1000 units daily, vitamin C 1000 mg daily, and a zinc supplement. Please take prescribed medications as directed.  You have been enrolled in a MyChart symptom monitoring program. Please answer these questions daily so we can keep track of how you are doing.  You were to quarantine for 5 days from onset of your symptoms.  After day 5, if you have had no fever and you are feeling better, you can end quarantine but need to mask for an additional 5 days. After day 5 if you have a fever or are having significant symptoms, please quarantine for full 10 days.  If you note any worsening of symptoms, any significant shortness of breath or any chest pain, please seek ER evaluation ASAP.  Please do not delay care!  COVID-19: What to Do if You Are Sick If you test positive and are an older adult or someone who is at high risk of getting very sick from COVID-19, treatment may be available. Contact a healthcare provider right away after a positive test to determine if  you are eligible, even if your symptoms are mild right now. You can also visit a Test to Treat location and, if eligible, receive a prescription from a provider. Don't delay: Treatment must be started within the first few days to be effective. If you have a fever, cough, or other symptoms, you might have COVID-19. Most people have mild illness and are able to recover at home. If you are sick: Keep track of your symptoms. If you have an emergency warning sign (including trouble  breathing), call 911. Steps to help prevent the spread of COVID-19 if you are sick If you are sick with COVID-19 or think you might have COVID-19, follow the steps below to care for yourself and to help protect other people in your home and community. Stay home except to get medical care Stay home. Most people with COVID-19 have mild illness and can recover at home without medical care. Do not leave your home, except to get medical care. Do not visit public areas and do not go to places where you are unable to wear a mask. Take care of yourself. Get rest and stay hydrated. Take over-the-counter medicines, such as acetaminophen, to help you feel better. Stay in touch with your doctor. Call before you get medical care. Be sure to get care if you have trouble breathing, or have any other emergency warning signs, or if you think it is an emergency. Avoid public transportation, ride-sharing, or taxis if possible. Get tested If you have symptoms of COVID-19, get tested. While waiting for test results, stay away from others, including staying apart from those living in your household. Get tested as soon as possible after your symptoms start. Treatments may be available for people with COVID-19 who are at risk for becoming very sick. Don't delay: Treatment must be started early to be effective--some treatments must begin within 5 days of your first symptoms. Contact your healthcare provider right away if your test result is positive to determine if you are eligible. Self-tests are one of several options for testing for the virus that causes COVID-19 and may be more convenient than laboratory-based tests and point-of-care tests. Ask your healthcare provider or your local health department if you need help interpreting your test results. You can visit your state, tribal, local, and territorial health department's website to look for the latest local information on testing sites. Separate yourself from other  people As much as possible, stay in a specific room and away from other people and pets in your home. If possible, you should use a separate bathroom. If you need to be around other people or animals in or outside of the home, wear a well-fitting mask. Tell your close contacts that they may have been exposed to COVID-19. An infected person can spread COVID-19 starting 48 hours (or 2 days) before the person has any symptoms or tests positive. By letting your close contacts know they may have been exposed to COVID-19, you are helping to protect everyone. See COVID-19 and Animals if you have questions about pets. If you are diagnosed with COVID-19, someone from the health department may call you. Answer the call to slow the spread. Monitor your symptoms Symptoms of COVID-19 include fever, cough, or other symptoms. Follow care instructions from your healthcare provider and local health department. Your local health authorities may give instructions on checking your symptoms and reporting information. When to seek emergency medical attention Look for emergency warning signs* for COVID-19. If someone is showing any of these  signs, seek emergency medical care immediately: Trouble breathing Persistent pain or pressure in the chest New confusion Inability to wake or stay awake Pale, gray, or blue-colored skin, lips, or nail beds, depending on skin tone *This list is not all possible symptoms. Please call your medical provider for any other symptoms that are severe or concerning to you. Call 911 or call ahead to your local emergency facility: Notify the operator that you are seeking care for someone who has or may have COVID-19. Call ahead before visiting your doctor Call ahead. Many medical visits for routine care are being postponed or done by phone or telemedicine. If you have a medical appointment that cannot be postponed, call your doctor's office, and tell them you have or may have COVID-19. This will  help the office protect themselves and other patients. If you are sick, wear a well-fitting mask You should wear a mask if you must be around other people or animals, including pets (even at home). Wear a mask with the best fit, protection, and comfort for you. You don't need to wear the mask if you are alone. If you can't put on a mask (because of trouble breathing, for example), cover your coughs and sneezes in some other way. Try to stay at least 6 feet away from other people. This will help protect the people around you. Masks should not be placed on young children under age 78 years, anyone who has trouble breathing, or anyone who is not able to remove the mask without help. Cover your coughs and sneezes Cover your mouth and nose with a tissue when you cough or sneeze. Throw away used tissues in a lined trash can. Immediately wash your hands with soap and water for at least 20 seconds. If soap and water are not available, clean your hands with an alcohol-based hand sanitizer that contains at least 60% alcohol. Clean your hands often Wash your hands often with soap and water for at least 20 seconds. This is especially important after blowing your nose, coughing, or sneezing; going to the bathroom; and before eating or preparing food. Use hand sanitizer if soap and water are not available. Use an alcohol-based hand sanitizer with at least 60% alcohol, covering all surfaces of your hands and rubbing them together until they feel dry. Soap and water are the best option, especially if hands are visibly dirty. Avoid touching your eyes, nose, and mouth with unwashed hands. Handwashing Tips Avoid sharing personal household items Do not share dishes, drinking glasses, cups, eating utensils, towels, or bedding with other people in your home. Wash these items thoroughly after using them with soap and water or put in the dishwasher. Clean surfaces in your home regularly Clean and disinfect high-touch  surfaces (for example, doorknobs, tables, handles, light switches, and countertops) in your "sick room" and bathroom. In shared spaces, you should clean and disinfect surfaces and items after each use by the person who is ill. If you are sick and cannot clean, a caregiver or other person should only clean and disinfect the area around you (such as your bedroom and bathroom) on an as needed basis. Your caregiver/other person should wait as long as possible (at least several hours) and wear a mask before entering, cleaning, and disinfecting shared spaces that you use. Clean and disinfect areas that may have blood, stool, or body fluids on them. Use household cleaners and disinfectants. Clean visible dirty surfaces with household cleaners containing soap or detergent. Then, use a household disinfectant.  Use a product from [employer] List N: Disinfectants for Coronavirus (PYKDX-83). Be sure to follow the instructions on the label to ensure safe and effective use of the product. Many products recommend keeping the surface wet with a disinfectant for a certain period of time (look at "contact time" on the product label). You may also need to wear personal protective equipment, such as gloves, depending on the directions on the product label. Immediately after disinfecting, wash your hands with soap and water for 20 seconds. For completed guidance on cleaning and disinfecting your home, visit Complete Disinfection Guidance. Take steps to improve ventilation at home Improve ventilation (air flow) at home to help prevent from spreading COVID-19 to other people in your household. Clear out COVID-19 virus particles in the air by opening windows, using air filters, and turning on fans in your home. Use this interactive tool to learn how to improve air flow in your home. When you can be around others after being sick with COVID-19 Deciding when you can be around others is different for different situations. Find out  when you can safely end home isolation. For any additional questions about your care, contact your healthcare provider or state or local health department. 09/04/2020 Content source: [facility] for Immunization and Respiratory Diseases ([facility]), Division of Viral Diseases This information is not intended to replace advice given to you by your health care provider. Make sure you discuss any questions you have with your health care provider. Document Revised: 10/18/2020 Document Reviewed: 10/18/2020 Elsevier Patient Education  2022 [employer].      If you have been instructed to have an in-person evaluation today at a local Urgent Care facility, please use the link below. It will take you to a list of all of our available [facility], including address, phone number and hours of operation. Please do not delay care.  [facility]  If you or a family member do not have a primary care provider, use the link below to schedule a visit and establish care. When you choose a [facility] primary care physician or advanced practice provider, you gain a long-term partner in health. Find a Primary Care Provider  Learn more about [facility]'s in-office and virtual care options: [facility]

## 2022-06-17 NOTE — Progress Notes (Signed)
Virtual Visit Consent   Regina Ruiz, you are scheduled for a virtual visit with a [facility] provider today. Just as with appointments in the office, your consent must be obtained to participate. Your consent will be active for this visit and any virtual visit you may have with one of our providers in the next 365 days. If you have a MyChart account, a copy of this consent can be sent to you electronically.  As this is a virtual visit, video technology does not allow for your provider to perform a traditional examination. This may limit your provider's ability to fully assess your condition. If your provider identifies any concerns that need to be evaluated in person or the need to arrange testing (such as labs, EKG, etc.), we will make arrangements to do so. Although advances in technology are sophisticated, we cannot ensure that it will always work on either your end or our end. If the connection with a video visit is poor, the visit may have to be switched to a telephone visit. With either a video or telephone visit, we are not always able to ensure that we have a secure connection.  By engaging in this virtual visit, you consent to the provision of healthcare and authorize for your insurance to be billed (if applicable) for the services provided during this visit. Depending on your insurance coverage, you may receive a charge related to this service.  I need to obtain your verbal consent now. Are you willing to proceed with your visit today? Regina Ruiz has provided verbal consent on 06/17/2022 for a virtual visit (video or telephone). Regina Ruiz, [place]  Date: 06/17/2022 1:01 PM  Virtual Visit via Video Note   I, Regina Ruiz, connected with  Regina Ruiz  ([id number], 66-Jun-1957) on 06/17/22 at  1:00 PM EST by a video-enabled telemedicine application and verified that I am speaking with the correct person using two identifiers.  Location: Patient: Virtual Visit Location Patient:  Home Provider: Virtual Visit Location Provider: Home Office   I discussed the limitations of evaluation and management by telemedicine and the availability of in person appointments. The patient expressed understanding and agreed to proceed.    History of Present Illness: Regina Ruiz is a 67 y.o. who identifies as a female who was assigned female at birth, and is being seen today for COVID-19. Endorses symptoms starting Friday night into Saturday with chills, fatigue, aches. Monday noting increased congestion and cough. Some SOB and tightness with exertion. Denies GI symptoms. COVID test this morning at home -- positive. Some anorexia.   HPI: HPI  Problems:  Patient Active Problem List   Diagnosis Date Noted   Abnormal chest x-ray 04/07/2022   Rash and nonspecific skin eruption 02/27/2022   Allergic rhinitis 02/27/2022   Localized swelling, mass or lump of neck 02/27/2022   Epigastric burning sensation 10/14/2019   Estrogen deficiency 12/28/2018   Screening mammogram, encounter for 12/28/2018   Postinflammatory pulmonary fibrosis ([place]) 06/29/2017   Upper airway cough syndrome 06/29/2017   Smokers' cough ([place]) 02/26/2016   Encounter for routine gynecological examination 02/26/2016   Nocturnal leg cramps 07/12/2013   Encounter for annual routine gynecological examination 12/16/2011   Sleep disorder 03/21/2011   Hypothyroid 02/19/2011   Routine general medical examination at a health care facility 10/31/2010   Vitamin D deficiency 12/06/2009   PULMONARY NODULE 08/30/2008   CIGARETTE SMOKER 08/25/2008   Anxiety 09/01/2006   COPD GOLD II still smoking  09/01/2006   Osteopenia  04/16/2006    Allergies:  Allergies  Allergen Reactions   Butalbital-Aspirin-Caffeine     REACTION: unspecified   Fosamax [Alendronate Sodium]     GI   Meperidine Hcl     REACTION: unspecified   Methocarbamol Other (See Comments)    Causes Headaches/ Migraines   Zolpidem Tartrate    Medications:   Current Outpatient Medications:    albuterol (VENTOLIN HFA) 108 (90 Base) MCG/ACT inhaler, Inhale 2 puffs into the lungs every 6 (six) hours as needed for wheezing or shortness of breath., Disp: 8 g, Rfl: 0   benzonatate (TESSALON) 100 MG capsule, Take 1 capsule (100 mg total) by mouth 3 (three) times daily as needed for cough., Disp: 30 capsule, Rfl: 0   nirmatrelvir/ritonavir (PAXLOVID) 20 x 150 MG & 10 x '100MG'$  TABS, Take 3 tablets by mouth 2 (two) times daily for 5 days. (Take nirmatrelvir 150 mg two tablets twice daily for 5 days and ritonavir 100 mg one tablet twice daily for 5 days) Patient GFR is > 63 (calculated), Disp: 30 tablet, Rfl: 0   ALPRAZolam (XANAX) 0.25 MG tablet, TAKE 1 TAB BY MOUTH 2 TIMES DAILY AS NEEDED, Disp: 60 tablet, Rfl: 0   ketoconazole (NIZORAL) 2 % cream, Apply 1 Application topically 2 (two) times daily as needed for irritation (rash). To affected rash areas, Disp: 30 g, Rfl: 0   levothyroxine (SYNTHROID) 75 MCG tablet, Take 75 mcg by mouth daily before breakfast., Disp: , Rfl:    Multiple Vitamins-Minerals (MULTIVITAMIN WITH MINERALS) tablet, Take 1 tablet by mouth daily., Disp: , Rfl:    SUMAtriptan (IMITREX) 100 MG tablet, Take 1 pill orally for migraine - if necessary may repeat once in 2 hours, Disp: 10 tablet, Rfl: 3   SYMBICORT 160-4.5 MCG/ACT inhaler, INHALE 2 PUFFS INTO THE LUNGS TWICE DAILY, Disp: 10.2 Inhaler, Rfl: 0  Observations/Objective: Patient is well-developed, well-nourished in no acute distress.  Resting comfortably at home.  Head is normocephalic, atraumatic.  No labored breathing. Speech is clear and coherent with logical content.  Patient is alert and oriented at baseline.   Assessment and Plan: 1. COVID-19 - benzonatate (TESSALON) 100 MG capsule; Take 1 capsule (100 mg total) by mouth 3 (three) times daily as needed for cough.  Dispense: 30 capsule; Refill: 0 - MyChart COVID-19 home monitoring program; Future - albuterol (VENTOLIN HFA)  108 (90 Base) MCG/ACT inhaler; Inhale 2 puffs into the lungs every 6 (six) hours as needed for wheezing or shortness of breath.  Dispense: 8 g; Refill: 0 - nirmatrelvir/ritonavir (PAXLOVID) 20 x 150 MG & 10 x '100MG'$  TABS; Take 3 tablets by mouth 2 (two) times daily for 5 days. (Take nirmatrelvir 150 mg two tablets twice daily for 5 days and ritonavir 100 mg one tablet twice daily for 5 days) Patient GFR is > 63 (calculated)  Dispense: 30 tablet; Refill: 0  Patient with multiple risk factors for complicated course of illness. Discussed risks/benefits of antiviral medications including most common potential ADRs. Patient voiced understanding and would like to proceed with antiviral medication. They are candidate for Paxlovid. Rx sent to pharmacy. Supportive measures, OTC medications and vitamin regimen reviewed. Tessalon as directed and albuterol per orders. Patient has been enrolled in a MyChart COVID symptom monitoring program. Samule Dry reviewed in detail. Strict ER precautions discussed with patient.    Follow Up Instructions: I discussed the assessment and treatment plan with the patient. The patient was provided an opportunity to ask questions and all were answered. The patient  agreed with the plan and demonstrated an understanding of the instructions.  A copy of instructions were sent to the patient via MyChart unless otherwise noted below.   The patient was advised to call back or seek an in-person evaluation if the symptoms worsen or if the condition fails to improve as anticipated.  Time:  I spent 10 minutes with the patient via telehealth technology discussing the above problems/concerns.    Regina Rio, PA-C

## 2022-06-20 ENCOUNTER — Ambulatory Visit: Payer: Self-pay | Admitting: *Deleted

## 2022-06-20 ENCOUNTER — Encounter: Payer: Self-pay | Admitting: Family Medicine

## 2022-06-20 ENCOUNTER — Telehealth: Payer: Self-pay

## 2022-06-20 NOTE — Telephone Encounter (Signed)
Triaged due to [place]  Chief Complaint: cough- SOB with cough, + COVID Symptoms: cough, chest congestion, sore from cough Frequency: symptoms started Friday- virtual appointment Tuesday- some better- cough is bothersome- with chest soreness and SOB with coughing spells Pertinent Negatives: Patient denies SOB at times other than coughing Disposition: '[]'$ ED /'[]'$ Urgent Care (no appt availability in office) / '[]'$ Appointment(In office/virtual)/ '[]'$  [facility]/ '[]'$ Home Care/ '[]'$ Refused Recommended Disposition /'[]'$  Mobile Bus/ '[x]'$  Follow-up with PCP Additional Notes: Patient advised call provider- advise of status- PCP may want to adjust treatment- make sure getting plenty of fluids, eating, using OTC as advised. Reach out to UC virtual option over weekend if needed and go to ED if symptoms get worse- SOB- labored breathing

## 2022-06-20 NOTE — Telephone Encounter (Signed)
Reason for Disposition  [1] Continuous (nonstop) coughing interferes with work or school AND [2] no improvement using cough treatment per Care Advice  Answer Assessment - Initial Assessment Questions 1. RESPIRATORY STATUS: "Describe your breathing?" (e.g., wheezing, shortness of breath, unable to speak, severe coughing)      SOB when coughing- patient reports she is doing better with breathing 2. ONSET: "When did this breathing problem begin?"      Last Friday- tested + COVID Monday 3. PATTERN "Does the difficult breathing come and go, or has it been constant since it started?"      Comes and goes with coughing- using albuterol 4. SEVERITY: "How bad is your breathing?" (e.g., mild, moderate, severe)    - MILD: No SOB at rest, mild SOB with walking, speaks normally in sentences, can lie down, no retractions, pulse < 100.    - MODERATE: SOB at rest, SOB with minimal exertion and prefers to sit, cannot lie down flat, speaks in phrases, mild retractions, audible wheezing, pulse 100-120.    - SEVERE: Very SOB at rest, speaks in single words, struggling to breathe, sitting hunched forward, retractions, pulse > 120      With cough only 5. RECURRENT SYMPTOM: "Have you had difficulty breathing before?" If Yes, ask: "When was the last time?" and "What happened that time?"      Hx- COPD 6. CARDIAC HISTORY: "Do you have any history of heart disease?" (e.g., heart attack, angina, bypass surgery, angioplasty)      no 7. LUNG HISTORY: "Do you have any history of lung disease?"  (e.g., pulmonary embolus, asthma, emphysema)     COPD 8. CAUSE: "What do you think is causing the breathing problem?"      COVID 9. OTHER SYMPTOMS: "Do you have any other symptoms? (e.g., dizziness, runny nose, cough, chest pain, fever)     Chest soreness, no appetite  Protocols used: Breathing Difficulty-A-AH, Cough - Acute Productive-A-AH

## 2022-06-20 NOTE — Telephone Encounter (Signed)
Called patient in regard to COVID symptoms. Patient stated she was having some SOB and chest pain from coughing. Advise patient to nurse triage.

## 2022-06-24 ENCOUNTER — Encounter: Payer: Self-pay | Admitting: Family Medicine

## 2022-06-24 ENCOUNTER — Ambulatory Visit (INDEPENDENT_AMBULATORY_CARE_PROVIDER_SITE_OTHER): Payer: 59 | Admitting: Family Medicine

## 2022-06-24 VITALS — BP 130/70 | HR 89 | Temp 98.2°F | Ht 62.0 in | Wt 128.4 lb

## 2022-06-24 DIAGNOSIS — J441 Chronic obstructive pulmonary disease with (acute) exacerbation: Secondary | ICD-10-CM

## 2022-06-24 DIAGNOSIS — U071 COVID-19: Secondary | ICD-10-CM | POA: Diagnosis not present

## 2022-06-24 MED ORDER — PREDNISONE 20 MG PO TABS: ORAL_TABLET | ORAL | 0 refills | Status: DC

## 2022-06-24 NOTE — Patient Instructions (Addendum)
Complete prednisone taper.  Use albuterol prn wheeze or shortness of breath.  Can use delsym or mucinex DM as needed for cough.  Call if new fever or worsening Shortness of breath for consideration on antibiotics.  GO to ER for severe shortness of breath.  Stay of cigarettes.

## 2022-06-24 NOTE — Progress Notes (Signed)
Patient ID: Regina Ruiz, female    DOB: [birth year], 67 y.o.   MRN: [id number]  This visit was conducted in person.  BP 130/70   Pulse 89   Temp 98.2 F (36.8 C) (Oral)   Ht '5\' 2"'$  (1.575 m)   Wt 128 lb 6 oz (58.2 kg)   LMP 06/16/2005   SpO2 97%   BMI 23.48 kg/m    CC:  Chief Complaint  Patient presents with   Cough    Rib pain from coughing so much   Hoarse    06/17/22 Positive Covid-Virtual Visit and given Paxlovid   Shortness of Breath         Subjective:   HPI: Regina Ruiz is a 67 y.o. female presenting on 06/24/2022 for Cough (Rib pain from coughing so much), Hoarse (06/17/22 Positive Covid-Virtual Visit and given Paxlovid), and Shortness of Breath (/)  Date of onset: 06/17/22 Dx with COVID.[name]. treated with Paxlovid Reviewed recent virtual visit note from June 17, 2022. Now on day 12 of illness. Initial symptoms included  chills, cough, congestion and fatigue. She started feeling better, went to work yesterday.  No further fever, chills. Symptoms progressed to constant cough, SOB at rest , at night  Chest wall pain with cough  Cough productive, yellowish.  Some blood nasal discharge.   She has tried to treat with  Delsym, benzonatate.   History of chronic lung disease :COPD, pulmonary fibrosis, current smoker  On symbicort off and on.  Using albuterol inhaler prn 1-2 times daily.       Relevant past medical, surgical, family and social history reviewed and updated as indicated. Interim medical history since our last visit reviewed. Allergies and medications reviewed and updated. Outpatient Medications Prior to Visit  Medication Sig Dispense Refill   albuterol (VENTOLIN HFA) 108 (90 Base) MCG/ACT inhaler Inhale 2 puffs into the lungs every 6 (six) hours as needed for wheezing or shortness of breath. 8 g 0   ALPRAZolam (XANAX) 0.25 MG tablet TAKE 1 TAB BY MOUTH 2 TIMES DAILY AS NEEDED 60 tablet 0   benzonatate (TESSALON) 100 MG capsule Take 1 capsule (100 mg  total) by mouth 3 (three) times daily as needed for cough. 30 capsule 0   ketoconazole (NIZORAL) 2 % cream Apply 1 Application topically 2 (two) times daily as needed for irritation (rash). To affected rash areas 30 g 0   levothyroxine (SYNTHROID) 75 MCG tablet Take 75 mcg by mouth daily before breakfast.     Multiple Vitamins-Minerals (MULTIVITAMIN WITH MINERALS) tablet Take 1 tablet by mouth daily.     SUMAtriptan (IMITREX) 100 MG tablet Take 1 pill orally for migraine - if necessary may repeat once in 2 hours 10 tablet 3   SYMBICORT 160-4.5 MCG/ACT inhaler INHALE 2 PUFFS INTO THE LUNGS TWICE DAILY 10.2 Inhaler 0   No facility-administered medications prior to visit.     Per HPI unless specifically indicated in ROS section below Review of Systems  Constitutional:  Negative for fatigue and fever.  HENT:  Negative for congestion.   Eyes:  Negative for pain.  Respiratory:  Positive for cough, shortness of breath and wheezing.   Cardiovascular:  Negative for chest pain, palpitations and leg swelling.  Gastrointestinal:  Negative for abdominal pain.  Genitourinary:  Negative for dysuria and vaginal bleeding.  Musculoskeletal:  Negative for back pain.  Neurological:  Negative for syncope, light-headedness and headaches.  Psychiatric/Behavioral:  Negative for dysphoric mood.    Objective:  BP  130/70   Pulse 89   Temp 98.2 F (36.8 C) (Oral)   Ht '5\' 2"'$  (1.575 m)   Wt 128 lb 6 oz (58.2 kg)   LMP 06/16/2005   SpO2 97%   BMI 23.48 kg/m   Wt Readings from Last 3 Encounters:  06/24/22 128 lb 6 oz (58.2 kg)  04/03/22 131 lb 4 oz (59.5 kg)  02/27/22 127 lb (57.6 kg)      Physical Exam Constitutional:      General: She is not in acute distress.    Appearance: Normal appearance. She is well-developed. She is not ill-appearing or toxic-appearing.  HENT:     Head: Normocephalic.     Right Ear: Hearing, tympanic membrane, ear canal and external ear normal. Tympanic membrane is not  erythematous, retracted or bulging.     Left Ear: Hearing, tympanic membrane, ear canal and external ear normal. Tympanic membrane is not erythematous, retracted or bulging.     Nose: No mucosal edema or rhinorrhea.     Right Sinus: No maxillary sinus tenderness or frontal sinus tenderness.     Left Sinus: No maxillary sinus tenderness or frontal sinus tenderness.     Mouth/Throat:     Pharynx: Uvula midline.  Eyes:     General: Lids are normal. Lids are everted, no foreign bodies appreciated.     Conjunctiva/sclera: Conjunctivae normal.     Pupils: Pupils are equal, round, and reactive to light.  Neck:     Thyroid: No thyroid mass or thyromegaly.     Vascular: No carotid bruit.     Trachea: Trachea normal.  Cardiovascular:     Rate and Rhythm: Normal rate and regular rhythm.     Pulses: Normal pulses.     Heart sounds: Normal heart sounds, S1 normal and S2 normal. No murmur heard.    No friction rub. No gallop.  Pulmonary:     Effort: Pulmonary effort is normal. No tachypnea or respiratory distress.     Breath sounds: Decreased air movement present. Wheezing present. No decreased breath sounds, rhonchi or rales.     Comments: Scattered wheeze Abdominal:     General: Bowel sounds are normal.     Palpations: Abdomen is soft.     Tenderness: There is no abdominal tenderness.  Musculoskeletal:     Cervical back: Normal range of motion and neck supple.  Skin:    General: Skin is warm and dry.     Findings: No rash.  Neurological:     Mental Status: She is alert.  Psychiatric:        Mood and Affect: Mood is not anxious or depressed.        Speech: Speech normal.        Behavior: Behavior normal. Behavior is cooperative.        Thought Content: Thought content normal.        Judgment: Judgment normal.       Results for orders placed or performed in visit on 51/02/58  Basic metabolic panel  Result Value Ref Range   Sodium 141 135 - 145 mEq/L   Potassium 4.7 3.5 - 5.1 mEq/L    Chloride 103 96 - 112 mEq/L   CO2 35 (H) 19 - 32 mEq/L   Glucose, Bld 72 70 - 99 mg/dL   BUN 12 6 - 23 mg/dL   Creatinine, Ser 0.99 0.40 - 1.20 mg/dL   GFR 60.13 >60.00 mL/min   Calcium 9.1 8.4 - 10.5 mg/dL  CBC  with Differential/Platelet  Result Value Ref Range   WBC 9.3 4.0 - 10.5 K/uL   RBC 4.76 3.87 - 5.11 Mil/uL   Hemoglobin 14.5 12.0 - 15.0 g/dL   HCT 43.3 36.0 - 46.0 %   MCV 91.0 78.0 - 100.0 fl   MCHC 33.6 30.0 - 36.0 g/dL   RDW 12.6 11.5 - 15.5 %   Platelets 296.0 150.0 - 400.0 K/uL   Neutrophils Relative % 69.4 43.0 - 77.0 %   Lymphocytes Relative 20.9 12.0 - 46.0 %   Monocytes Relative 8.0 3.0 - 12.0 %   Eosinophils Relative 0.9 0.0 - 5.0 %   Basophils Relative 0.8 0.0 - 3.0 %   Neutro Abs 6.5 1.4 - 7.7 K/uL   Lymphs Abs 1.9 0.7 - 4.0 K/uL   Monocytes Absolute 0.7 0.1 - 1.0 K/uL   Eosinophils Absolute 0.1 0.0 - 0.7 K/uL   Basophils Absolute 0.1 0.0 - 0.1 K/uL    Assessment and Plan COVID-19  COPD exacerbation (HCC)  Other orders -     predniSONE; 3 tabs by mouth daily x 3 days, then 2 tabs by mouth daily x 2 days then 1 tab by mouth daily x 2 days  Dispense: 15 tablet; Refill: 0    COPD exacerbation due to COVID  No clear sign of bacterial superinfection at this point. Will treat with prednisone taper, cough suppressant and albuterol as needed. ER and return precautions provided.    Eliezer Lofts, MD

## 2022-06-25 ENCOUNTER — Encounter: Payer: Self-pay | Admitting: Family Medicine

## 2022-06-26 MED ORDER — AZITHROMYCIN 250 MG PO TABS: ORAL_TABLET | ORAL | 0 refills | Status: DC

## 2022-09-11 ENCOUNTER — Other Ambulatory Visit: Payer: Self-pay | Admitting: Family Medicine

## 2022-09-24 ENCOUNTER — Other Ambulatory Visit: Payer: Self-pay | Admitting: Family Medicine

## 2022-09-24 ENCOUNTER — Encounter: Payer: Self-pay | Admitting: Family Medicine

## 2022-09-24 NOTE — Telephone Encounter (Signed)
Please see mychart message. Looks like last filled by Deeann Saint, PA-C on 06/17/22 #30 tabs, not on med list anymore

## 2022-09-24 NOTE — Telephone Encounter (Signed)
See refill request from pharmacy 

## 2022-10-03 LAB — LAB REPORT - SCANNED: EGFR: 87

## 2023-04-29 ENCOUNTER — Ambulatory Visit: Payer: 59 | Admitting: Family Medicine

## 2023-04-29 ENCOUNTER — Encounter: Payer: Self-pay | Admitting: Family Medicine

## 2023-04-29 VITALS — BP 138/70 | HR 82 | Temp 98.1°F | Ht 62.0 in | Wt 132.4 lb

## 2023-04-29 DIAGNOSIS — E042 Nontoxic multinodular goiter: Secondary | ICD-10-CM

## 2023-04-29 DIAGNOSIS — E890 Postprocedural hypothyroidism: Secondary | ICD-10-CM | POA: Diagnosis not present

## 2023-04-29 DIAGNOSIS — E559 Vitamin D deficiency, unspecified: Secondary | ICD-10-CM | POA: Diagnosis not present

## 2023-04-29 DIAGNOSIS — J410 Simple chronic bronchitis: Secondary | ICD-10-CM | POA: Diagnosis not present

## 2023-04-29 DIAGNOSIS — M810 Age-related osteoporosis without current pathological fracture: Secondary | ICD-10-CM | POA: Diagnosis not present

## 2023-04-29 MED ORDER — LEVOTHYROXINE SODIUM 75 MCG PO TABS: 75.0000 ug | ORAL_TABLET | Freq: Every day | ORAL | 1 refills | Status: DC

## 2023-04-29 MED ORDER — BENZONATATE 200 MG PO CAPS: 200.0000 mg | ORAL_CAPSULE | Freq: Three times a day (TID) | ORAL | 3 refills | Status: DC | PRN

## 2023-04-29 NOTE — Assessment & Plan Note (Signed)
Last dexa was in 04/2022 endo office  Her endocrinologist retired Smoker with hypothyroidism and past hyperthyroid No recent falls or fracture (one past non fragility fracture)  Lowest T score -2.6 Unable to tolerate oral alendronate in past (GI side effects) Ins would not cover prolia when last checked  ? Candidate for reclast Plans to get utd with dental care in January then follow up to discuss treatment opt Discussed fall prevention, supplements and exercise for bone density  Will get back on vit d

## 2023-04-29 NOTE — Patient Instructions (Addendum)
Get your dental appointment done   Then set up appointment here for discussion of osteoporosis   Start taking 4000 international units of vitamin D3 daily   I will order a thyroid ultrasound  If you don't get a call for that in 1-2 weeks let us know

## 2023-04-29 NOTE — Assessment & Plan Note (Signed)
Pt has been off her vitamin D Encouraged to start 4000 international units daily for bone and overall health

## 2023-04-29 NOTE — Assessment & Plan Note (Signed)
Post ablative-former history of hyperthyroid treated with I-131 Reviewed last notes, labs and studies from endocrinology (Dr Judie Petit retired)  No clinical changes Taking levothyroxine DAW 75 mcg daily -refilled  Due for [place], ordered Last labs from endo dated 10/03/22  TSH 1.450 T4 10.5 T3 uptake 30 FT index 3.2 T3 free 2.9

## 2023-04-29 NOTE — Progress Notes (Signed)
Subjective:    Patient ID: Regina Ruiz, female    DOB: [birth year], 67 y.o.   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 in 04 with a hot nodule  Has had FNA in past- benign  Feeling good    Dr Sharin Grave Last visit was in April  Levothyroxine DAW -now on 75 mcg   Last labs from endo dated 10/03/22  TSH 1.450 T4 10.5 T3 uptake 30 FT index 3.2 T3 free 2.9  Here  Lab Results  Component Value Date   TSH 1.39 09/09/2021   Was getting ultrasound every 6-12 months  Everything was stable   No swelling of neck No swallowing problems    Dental appointment upcoming   Osteoporosis  Dexa 04/2022 with lowest T score -2.6 No recent falls  No recent fractures   (wrist fracture years ago - not fragility fracture)   Intol of fosamax - GI     Prolia - was listed but she never took it  Ins did not cover it then    Vit D def Last vitamin D Lab Results  Component Value Date   VD25OH 42.63 09/09/2021   Was taking 2000 international units D3 Off for a while        Patient Active Problem List   Diagnosis Date Noted   Multiple thyroid nodules 04/29/2023   Abnormal chest x-ray 04/07/2022   Rash and nonspecific skin eruption 02/27/2022   Allergic rhinitis 02/27/2022   Localized swelling, mass or lump of neck 02/27/2022   Epigastric burning sensation 10/14/2019   Estrogen deficiency 12/28/2018   Screening mammogram, encounter for 12/28/2018   COPD exacerbation (HCC) 04/09/2018   Postinflammatory pulmonary fibrosis (HCC)  06/29/2017   Upper airway cough syndrome 06/29/2017   Smokers' cough (HCC) 02/26/2016   Encounter for routine gynecological examination 02/26/2016   Nocturnal leg cramps 07/12/2013   Encounter for annual routine gynecological examination 12/16/2011   Sleep disorder 03/21/2011   Hypothyroid 02/19/2011   Routine general medical examination at a health care facility 10/31/2010   Vitamin D deficiency 12/06/2009   PULMONARY NODULE 08/30/2008   CIGARETTE SMOKER 08/25/2008   Anxiety 09/01/2006   COPD GOLD II still smoking  09/01/2006   Osteoporosis 04/16/2006   Past Medical History:  Diagnosis Date   Anxiety    Colitis    COPD (chronic obstructive pulmonary disease) (HCC)    Depression    Osteopenia    Thyroid disease    Past Surgical History:  Procedure Laterality Date   DILATION AND CURETTAGE OF UTERUS     endomet polyp  6/12   removed    ENDOMETRIAL BIOPSY  6/12   Dr Audie Box- negative   HYSTEROSCOPY  6.7.12   [facility]: ENDOMETRIAL POLYP   Social History   Tobacco Use   Smoking status: Every Day    Current packs/day: 1.00    Average packs/day: 1 pack/day for 46.0 years (46.0 ttl  pk-yrs)    Types: Cigarettes   Smokeless tobacco: Never  Vaping Use   Vaping status: Never Used  Substance Use Topics   Alcohol use: Yes    Alcohol/week: 0.0 standard drinks of alcohol    Comment: rare   Drug use: No   Family History  Problem Relation Age of Onset   Cancer Father        lung cancer and brain cancer   Breast cancer Sister    Stroke Maternal Grandfather    Stroke Paternal Grandfather    Heart disease Mother    Allergies  Allergen Reactions   Butalbital-Aspirin-Caffeine     REACTION: unspecified   Fosamax [Alendronate Sodium]     GI   Meperidine Hcl     REACTION: unspecified   Methocarbamol Other (See Comments)    Causes Headaches/ Migraines   Zolpidem Tartrate    Current Outpatient Medications on File Prior to Visit  Medication Sig Dispense Refill   albuterol  (VENTOLIN HFA) 108 (90 Base) MCG/ACT inhaler Inhale 2 puffs into the lungs every 6 (six) hours as needed for wheezing or shortness of breath. 8 g 0   ALPRAZolam (XANAX) 0.25 MG tablet TAKE 1 TAB BY MOUTH 2 TIMES DAILY AS NEEDED 60 tablet 0   cholecalciferol (VITAMIN D3) 25 MCG (1000 UNIT) tablet Take 4,000 Units by mouth daily.     ketoconazole (NIZORAL) 2 % cream Apply 1 Application topically 2 (two) times daily as needed for irritation (rash). To affected rash areas 30 g 0   Multiple Vitamins-Minerals (MULTIVITAMIN WITH MINERALS) tablet Take 1 tablet by mouth daily.     SUMAtriptan (IMITREX) 100 MG tablet Take 1 pill orally for migraine - if necessary may repeat once in 2 hours 10 tablet 3   SYMBICORT 160-4.5 MCG/ACT inhaler INHALE 2 PUFFS INTO THE LUNGS TWICE DAILY 10.2 Inhaler 0   No current facility-administered medications on file prior to visit.    Review of Systems  Constitutional:  Negative for activity change, appetite change, fatigue, fever and unexpected weight change.  HENT:  Negative for congestion, ear pain, rhinorrhea, sinus pressure, sore throat, trouble swallowing and voice change.   Eyes:  Negative for pain, redness and visual disturbance.  Respiratory:  Negative for cough, shortness of breath and wheezing.   Cardiovascular:  Negative for chest pain and palpitations.  Gastrointestinal:  Negative for abdominal pain, blood in stool, constipation and diarrhea.  Endocrine: Negative for polydipsia and polyuria.  Genitourinary:  Negative for dysuria, frequency and urgency.  Musculoskeletal:  Negative for arthralgias, back pain and myalgias.  Skin:  Negative for pallor and rash.  Allergic/Immunologic: Negative for environmental allergies.  Neurological:  Negative for dizziness, tremors, syncope, weakness and headaches.  Hematological:  Negative for adenopathy. Does not bruise/bleed easily.  Psychiatric/Behavioral:  Negative for decreased concentration and dysphoric mood. The  patient is not nervous/anxious.        High stress level       Objective:   Physical Exam Constitutional:      General: She is not in acute distress.    Appearance: Normal appearance. She is well-developed and normal weight. She is not ill-appearing or diaphoretic.  HENT:     Head: Normocephalic and atraumatic.     Mouth/Throat:     Comments: Fair dentition  Eyes:     Conjunctiva/sclera: Conjunctivae normal.     Pupils: Pupils are equal, round, and reactive to light.  Neck:     Thyroid: No thyromegaly.  Vascular: No carotid bruit or JVD.  Cardiovascular:     Rate and Rhythm: Normal rate and regular rhythm.     Heart sounds: Normal heart sounds.     No gallop.  Pulmonary:     Effort: Pulmonary effort is normal. No respiratory distress.     Breath sounds: Normal breath sounds. No wheezing or rales.     Comments: Diffusely distant bs  Abdominal:     General: There is no distension or abdominal bruit.     Palpations: Abdomen is soft.  Musculoskeletal:     Cervical back: Normal range of motion and neck supple.     Right lower leg: No edema.     Left lower leg: No edema.  Lymphadenopathy:     Cervical: No cervical adenopathy.  Skin:    General: Skin is warm and dry.     Coloration: Skin is not pale.     Findings: No rash.  Neurological:     Mental Status: She is alert.     Cranial Nerves: No cranial nerve deficit.     Sensory: No sensory deficit.     Motor: No weakness or tremor.     Coordination: Coordination normal.     Deep Tendon Reflexes: Reflexes are normal and symmetric. Reflexes normal.  Psychiatric:        Mood and Affect: Mood normal.           Assessment & Plan:   Problem List Items Addressed This Visit       Respiratory   Smokers' cough (HCC)    Refilled tessalon  Disc in detail risks of smoking and possible outcomes including copd, vascular/ heart disease, cancer , respiratory and sinus infections  Pt voices understanding Pt is not  interested in quitting at this point         Endocrine   Hypothyroid - Primary    Post ablative-former history of hyperthyroid treated with I-131 Reviewed last notes, labs and studies from endocrinology (Dr Judie Petit retired)  No clinical changes Taking levothyroxine DAW 75 mcg daily -refilled  Due for [place], ordered Last labs from endo dated 10/03/22  TSH 1.450 T4 10.5 T3 uptake 30 FT index 3.2 T3 free 2.9       Relevant Medications   levothyroxine (SYNTHROID) 75 MCG tablet   Multiple thyroid nodules    Due for her thyroid US (formerly done by Dr Patrecia Pace who retired) Will schedule this  No clinical changes  Reassuring exam       Relevant Medications   levothyroxine (SYNTHROID) 75 MCG tablet   Other Relevant Orders   US THYROID     Musculoskeletal and Integument   Osteoporosis    Last dexa was in 04/2022 endo office  Her endocrinologist retired Smoker with hypothyroidism and past hyperthyroid No recent falls or fracture (one past non fragility fracture)  Lowest T score -2.6 Unable to tolerate oral alendronate in past (GI side effects) Ins would not cover prolia when last checked  ? Candidate for reclast Plans to get utd with dental care in January then follow up to discuss treatment opt Discussed fall prevention, supplements and exercise for bone density  Will get back on vit d      Relevant Medications   cholecalciferol (VITAMIN D3) 25 MCG (1000 UNIT) tablet     Other   Vitamin D deficiency    Pt has been off her vitamin D Encouraged to start 4000 international units daily for bone and overall health

## 2023-04-29 NOTE — Assessment & Plan Note (Signed)
Refilled tessalon  Disc in detail risks of smoking and possible outcomes including copd, vascular/ heart disease, cancer , respiratory and sinus infections  Pt voices understanding Pt is not interested in quitting at this point

## 2023-04-29 NOTE — Assessment & Plan Note (Signed)
Due for her thyroid US (formerly done by Dr Patrecia Pace who retired) Will schedule this  No clinical changes  Reassuring exam

## 2023-05-05 ENCOUNTER — Ambulatory Visit
Admission: RE | Admit: 2023-05-05 | Discharge: 2023-05-05 | Disposition: A | Discharge status: Home / Self Care | Payer: 59 | Source: Ambulatory Visit | Attending: Family Medicine | Admitting: Family Medicine

## 2023-05-05 DIAGNOSIS — E042 Nontoxic multinodular goiter: Secondary | ICD-10-CM

## 2023-10-14 ENCOUNTER — Other Ambulatory Visit: Payer: Self-pay | Admitting: Family Medicine

## 2023-10-14 DIAGNOSIS — E890 Postprocedural hypothyroidism: Secondary | ICD-10-CM

## 2023-10-15 NOTE — Telephone Encounter (Signed)
 Due for labs  Last check was 09/2022 by endocrinology who she no longer sees  Set up for tsh for hypothyroidism  Refill for a month while waiting for that  Thanks

## 2023-10-15 NOTE — Telephone Encounter (Signed)
 No TSH levels checked since 2023, last OV was on 04/29/23 to discuss thyroid  (no labs done then), no future appts, please advise

## 2023-10-28 ENCOUNTER — Other Ambulatory Visit

## 2023-10-28 DIAGNOSIS — E890 Postprocedural hypothyroidism: Secondary | ICD-10-CM

## 2023-10-29 ENCOUNTER — Ambulatory Visit: Payer: Self-pay | Admitting: Family Medicine

## 2023-10-29 LAB — TSH: TSH: 1.4 u[IU]/mL (ref 0.35–5.50)

## 2023-10-30 ENCOUNTER — Other Ambulatory Visit: Payer: Self-pay | Admitting: Family Medicine

## 2023-10-30 MED ORDER — SYNTHROID 75 MCG PO TABS: 75.0000 ug | ORAL_TABLET | Freq: Every day | ORAL | 3 refills | Status: DC

## 2023-10-30 NOTE — Telephone Encounter (Signed)
 See pharmacy comments. Name brand needs a PA

## 2023-11-12 ENCOUNTER — Other Ambulatory Visit (HOSPITAL_COMMUNITY): Payer: Self-pay

## 2023-11-12 ENCOUNTER — Telehealth: Payer: Self-pay

## 2023-11-12 NOTE — Telephone Encounter (Signed)
 PA request has been Submitted. New Encounter has been or will be created for follow up. For additional info see Pharmacy Prior Auth telephone encounter from 05/29.

## 2023-11-12 NOTE — Telephone Encounter (Signed)
*  Primary  Pharmacy Patient Advocate Encounter   Received notification from RX Request Messages that prior authorization for Synthroid  tablets  is required/requested.   Insurance verification completed.   The patient is insured through [facility] .   Per test claim: PA required; PA started via CoverMyMeds. KEY [id number] . Please see clinical question(s) below that I am not finding the answer to in their chart and advise.

## 2023-11-13 MED ORDER — SYNTHROID 75 MCG PO TABS: 75.0000 ug | ORAL_TABLET | Freq: Every day | ORAL | 3 refills | Status: AC

## 2023-11-13 NOTE — Telephone Encounter (Signed)
 Please make patient aware and find out if there is a reason she cannot take the generic  Her prior doctor may have preferred the name brand   She may want to pay out of pocket, not sure

## 2023-11-13 NOTE — Telephone Encounter (Signed)
 Thanks for sending to the correct pharmacy  I apologize if I sent it to the wrong pharmacy I am currently out of town

## 2023-11-13 NOTE — Telephone Encounter (Signed)
 Left VM requesting pt to call the office back

## 2023-11-13 NOTE — Telephone Encounter (Signed)
 Pt said that she has been on the Synthroid  for many years and she has heard that if she goes to name brand that she may have side effs. However pt said med was suppose to go to [facility] and they don't charge her insurance because her insurance doesn't pay for name brand and they give her a discount for med. It looks like med was sent to [facility] on 10/30/23 in error and that's what prompted the PA request.  Called [facility] and cancelled the Rx with them and sent med to correct pharmacy.  Will route to PCP as a FYI and also the prior auth dpt so they can cancel the PA also

## 2023-12-16 ENCOUNTER — Other Ambulatory Visit: Payer: Self-pay | Admitting: Family Medicine

## 2023-12-17 NOTE — Telephone Encounter (Signed)
 Last filled on 04/29/23 #30 caps/ 3 refills, last OV was a f/u on 04/29/23

## 2024-04-26 ENCOUNTER — Ambulatory Visit

## 2024-04-26 VITALS — BP 131/66 | HR 89 | Resp 16 | Ht 63.0 in | Wt 129.6 lb

## 2024-04-26 DIAGNOSIS — J441 Chronic obstructive pulmonary disease with (acute) exacerbation: Secondary | ICD-10-CM

## 2024-04-26 LAB — POC COVID19 BINAXNOW: SARS Coronavirus 2 Ag: NEGATIVE

## 2024-04-26 LAB — POCT INFLUENZA A/B
Influenza A, POC: NEGATIVE
Influenza B, POC: NEGATIVE

## 2024-04-26 MED ORDER — PREDNISONE 20 MG PO TABS: 40.0000 mg | ORAL_TABLET | Freq: Every day | ORAL | 0 refills | Status: AC

## 2024-04-26 MED ORDER — ALBUTEROL SULFATE HFA 108 (90 BASE) MCG/ACT IN AERS: 2.0000 | INHALATION_SPRAY | Freq: Four times a day (QID) | RESPIRATORY_TRACT | 3 refills | Status: AC | PRN

## 2024-04-26 MED ORDER — AZITHROMYCIN 500 MG PO TABS
500.0000 mg | ORAL_TABLET | Freq: Every day | ORAL | 0 refills | Status: AC
Start: 2024-04-26 — End: 2024-04-29

## 2024-04-26 NOTE — Progress Notes (Signed)
 Acute visit   Patient: Regina Ruiz   DOB: 1956-05-09   69 y.o. Female  MRN: [id number] PCP: Randeen Laine LABOR, MD   Chief Complaint  Patient presents with   Acute Visit    Coughing- congestion- started saturday 11/08   Subjective    Discussed the use of AI scribe software for clinical note transcription with the patient, who gave verbal consent to proceed.  History of Present Illness Regina Ruiz is a 68 year old female with COPD who presents with cough and congestion.  She has been experiencing symptoms since Saturday, including coughing, congestion, and chills. Initial use of  cough syrup was ineffective, but she found some relief with Delsym since yesterday.  She notes a loss of taste and finds food tasteless, including chicken noodle soup and fries.  She has a history of COPD and has used albuterol  inhalers in the past, although her current inhaler has not been renewed recently. She also has Tessalon  Perles at home, which she refilled recently and finds somewhat helpful, though not effective for her current symptoms.  She reports feeling tired and experiencing fatigue. She has a history of smoking but is currently trying to quit using a product called Breathe Free, which involves inhaling flavored cartridges without smoke.  She works at [employer] and describes the office ventilation as poor, contributing to her exposure to illness.  Review of systems as noted in HPI.   Objective    BP 131/66 (BP Location: Right Arm, Patient Position: Sitting, Cuff Size: Normal)   Pulse 89   Resp 16   Ht 5' 3 (1.6 m)   Wt 129 lb 9.6 oz (58.8 kg)   LMP 06/16/2005   SpO2 100%   BMI 22.96 kg/m  Physical Exam Constitutional:      Appearance: Normal appearance.  HENT:     Head: Normocephalic and atraumatic.     Mouth/Throat:     Mouth: Mucous membranes are moist.  Eyes:     Pupils: Pupils are equal, round, and reactive to light.  Cardiovascular:     Rate and Rhythm: Normal rate  and regular rhythm.     Heart sounds: Normal heart sounds.  Pulmonary:     Effort: Pulmonary effort is normal. No respiratory distress.     Breath sounds: Wheezing present.  Skin:    General: Skin is warm.  Neurological:     General: No focal deficit present.     Mental Status: She is alert.       Results for orders placed or performed in visit on 04/26/24  POCT Influenza A/B  Result Value Ref Range   Influenza A, POC Negative Negative   Influenza B, POC Negative Negative  POC COVID-19  Result Value Ref Range   SARS Coronavirus 2 Ag Negative Negative    Assessment & Plan     Problem List Items Addressed This Visit       Respiratory   COPD exacerbation (HCC) - Primary   Relevant Medications   albuterol  (VENTOLIN  HFA) 108 (90 Base) MCG/ACT inhaler   predniSONE  (DELTASONE ) 20 MG tablet   azithromycin  (ZITHROMAX ) 500 MG tablet   Other Relevant Orders   POCT Influenza A/B (Completed)   POC COVID-19 (Completed)   Assessment & Plan Chronic obstructive pulmonary disease with acute exacerbation Patient with 3 day hx of cough, congestion, and wheezing concerning for COPD exacerbation. Likely 2/2 viral illness. Patient has not been using her inhalers regularly. Negative for COVID and flu today. -  Prescribed prednisone  40mg  for 5 days. - Prescribed azithromycin  500 mg for 3 days. - Provided new albuterol  inhaler for use every 4 hours as needed. - Recommend patient follow up with PCP to discuss daily maintenance inhaler   Meds ordered this encounter  Medications   albuterol  (VENTOLIN  HFA) 108 (90 Base) MCG/ACT inhaler    Sig: Inhale 2 puffs into the lungs every 6 (six) hours as needed for wheezing or shortness of breath.    Dispense:  8 g    Refill:  3   predniSONE  (DELTASONE ) 20 MG tablet    Sig: Take 2 tablets (40 mg total) by mouth daily with breakfast for 5 days.    Dispense:  10 tablet    Refill:  0   azithromycin  (ZITHROMAX ) 500 MG tablet    Sig: Take 1 tablet (500  mg total) by mouth daily for 3 days.    Dispense:  3 tablet    Refill:  0     No follow-ups on file.      Isaiah DELENA Pepper, MD  [facility] [phone] (phone) [phone] (fax)

## 2024-04-27 ENCOUNTER — Ambulatory Visit: Admitting: Family Medicine
